# Patient Record
Sex: Female | Born: 1940 | Race: White | Hispanic: No | Marital: Married | Smoking: Never smoker
Health system: Southern US, Community
[De-identification: ages and names within clinical notes are randomized; demographics above are authoritative.]

## PROBLEM LIST (undated history)

## (undated) DIAGNOSIS — I10 Essential (primary) hypertension: Secondary | ICD-10-CM

## (undated) DIAGNOSIS — M1990 Unspecified osteoarthritis, unspecified site: Secondary | ICD-10-CM

## (undated) DIAGNOSIS — K589 Irritable bowel syndrome without diarrhea: Secondary | ICD-10-CM

---

## 2010-03-16 ENCOUNTER — Encounter
Admission: RE | Admit: 2010-03-16 | Discharge: 2010-03-16 | Payer: Self-pay | Source: Home / Self Care | Attending: Gastroenterology | Admitting: Gastroenterology

## 2011-04-28 ENCOUNTER — Encounter (HOSPITAL_COMMUNITY): Payer: Self-pay | Admitting: Emergency Medicine

## 2011-04-28 ENCOUNTER — Emergency Department (HOSPITAL_COMMUNITY)
Admission: EM | Admit: 2011-04-28 | Discharge: 2011-04-28 | Disposition: A | Discharge status: Home / Self Care | Payer: Medicare Other | Attending: Emergency Medicine | Admitting: Emergency Medicine

## 2011-04-28 ENCOUNTER — Emergency Department (HOSPITAL_COMMUNITY): Payer: Medicare Other

## 2011-04-28 DIAGNOSIS — R112 Nausea with vomiting, unspecified: Secondary | ICD-10-CM | POA: Insufficient documentation

## 2011-04-28 DIAGNOSIS — R509 Fever, unspecified: Secondary | ICD-10-CM | POA: Insufficient documentation

## 2011-04-28 DIAGNOSIS — K589 Irritable bowel syndrome without diarrhea: Secondary | ICD-10-CM | POA: Insufficient documentation

## 2011-04-28 DIAGNOSIS — R109 Unspecified abdominal pain: Secondary | ICD-10-CM | POA: Insufficient documentation

## 2011-04-28 DIAGNOSIS — Z79899 Other long term (current) drug therapy: Secondary | ICD-10-CM | POA: Insufficient documentation

## 2011-04-28 DIAGNOSIS — R000 Tachycardia, unspecified: Secondary | ICD-10-CM | POA: Insufficient documentation

## 2011-04-28 DIAGNOSIS — R197 Diarrhea, unspecified: Secondary | ICD-10-CM | POA: Insufficient documentation

## 2011-04-28 HISTORY — DX: Unspecified osteoarthritis, unspecified site: M19.90

## 2011-04-28 HISTORY — DX: Irritable bowel syndrome without diarrhea: K58.9

## 2011-04-28 HISTORY — DX: Essential (primary) hypertension: I10

## 2011-04-28 LAB — COMPREHENSIVE METABOLIC PANEL
ALT: 19 U/L (ref 0–35)
Albumin: 3.4 g/dL — ABNORMAL LOW (ref 3.5–5.2)
BUN: 14 mg/dL (ref 6–23)
Calcium: 9.7 mg/dL (ref 8.4–10.5)
Chloride: 106 mEq/L (ref 96–112)
GFR calc Af Amer: 79 mL/min — ABNORMAL LOW (ref >90)
GFR calc non Af Amer: 68 mL/min — ABNORMAL LOW (ref >90)
Potassium: 4.3 mEq/L (ref 3.5–5.1)
Total Bilirubin: 0.8 mg/dL (ref 0.3–1.2)

## 2011-04-28 LAB — DIFFERENTIAL
Basophils Absolute: 0 10*3/uL (ref 0.0–0.1)
Basophils Relative: 0 % (ref 0–1)
Eosinophils Absolute: 0 10*3/uL (ref 0.0–0.7)
Eosinophils Relative: 0 % (ref 0–5)
Lymphs Abs: 2.3 10*3/uL (ref 0.7–4.0)
Monocytes Absolute: 1.2 10*3/uL — ABNORMAL HIGH (ref 0.1–1.0)

## 2011-04-28 LAB — URINALYSIS, ROUTINE W REFLEX MICROSCOPIC
Bilirubin Urine: NEGATIVE
Hgb urine dipstick: NEGATIVE
Leukocytes, UA: NEGATIVE
Nitrite: NEGATIVE
Specific Gravity, Urine: 1.009 (ref 1.005–1.030)

## 2011-04-28 LAB — CBC
HCT: 40.1 % (ref 36.0–46.0)
MCHC: 33.7 g/dL (ref 30.0–36.0)
MCV: 85.3 fL (ref 78.0–100.0)
RBC: 4.7 MIL/uL (ref 3.87–5.11)
RDW: 13.3 % (ref 11.5–15.5)

## 2011-04-28 LAB — OCCULT BLOOD, POC DEVICE: Fecal Occult Bld: NEGATIVE

## 2011-04-28 MED ORDER — GI COCKTAIL ~~LOC~~
30.0000 mL | Freq: Once | ORAL | Status: AC
  Administered 2011-04-28: 30 mL via ORAL
  Filled 2011-04-28: qty 30

## 2011-04-28 MED ORDER — DICYCLOMINE HCL 10 MG PO CAPS
20.0000 mg | ORAL_CAPSULE | Freq: Once | ORAL | Status: DC
  Filled 2011-04-28: qty 2

## 2011-04-28 MED ORDER — DICYCLOMINE HCL 20 MG PO TABS: 20.0000 mg | ORAL_TABLET | Freq: Two times a day (BID) | ORAL | Status: DC

## 2011-04-28 MED ORDER — SODIUM CHLORIDE 0.9 % IV BOLUS (SEPSIS)
1000.0000 mL | Freq: Once | INTRAVENOUS | Status: AC
  Administered 2011-04-28: 1000 mL via INTRAVENOUS

## 2011-04-28 MED ORDER — IOHEXOL 300 MG/ML  SOLN: 20.0000 mL | INTRAMUSCULAR | Status: AC

## 2011-04-28 MED ORDER — DICYCLOMINE HCL 10 MG/ML IM SOLN
20.0000 mg | Freq: Once | INTRAMUSCULAR | Status: DC
  Filled 2011-04-28 (×2): qty 2

## 2011-04-28 MED ORDER — IOHEXOL 300 MG/ML  SOLN
100.0000 mL | Freq: Once | INTRAMUSCULAR | Status: AC | PRN
  Administered 2011-04-28: 100 mL via INTRAVENOUS

## 2011-04-28 MED ORDER — DICYCLOMINE HCL 20 MG PO TABS
20.0000 mg | ORAL_TABLET | Freq: Once | ORAL | Status: AC
  Administered 2011-04-28: 20 mg via ORAL
  Filled 2011-04-28: qty 1

## 2011-04-28 MED ORDER — ONDANSETRON HCL 4 MG/2ML IJ SOLN
4.0000 mg | Freq: Once | INTRAMUSCULAR | Status: AC
  Administered 2011-04-28: 4 mg via INTRAVENOUS
  Filled 2011-04-28: qty 2

## 2011-04-28 NOTE — ED Provider Notes (Signed)
History     CSN: [id number]  Arrival date & time 04/28/11  1732   First MD Initiated Contact with Patient 04/28/11 1855      Chief Complaint  Patient presents with  . Emesis  . Diarrhea  . Fever    HPI The patient has a possible history of IBS.  She notes a history of episodic abdominal pain with nausea, vomiting, diarrhea.  Today she was experiencing atypical episode, that began gradually, with crampy diffuse abdominal discomfort and nausea that progressed to vomiting and diarrhea.  The patient also experienced chills, which is not entirely atypical but infrequently occurring.  The patient went to her physician's office and was found to be febrile she was referred here for additional evaluation. The patient notes minimal relief with OTC medications.  No clear exacerbating factors. No dyspnea, no chest pain, no confusion, no disorientation, no lower extremity edema, the patient notes that she is otherwise in generally good health. Past Medical History  Diagnosis Date  . Hypertension   . Arthritis   . IBS (irritable bowel syndrome)     History reviewed. No pertinent past surgical history.  History reviewed. No pertinent family history.  History  Substance Use Topics  . Smoking status: Never Smoker   . Smokeless tobacco: Not on file  . Alcohol Use: No    OB History    Grav Para Term Preterm Abortions TAB SAB Ect Mult Living                  Review of Systems  Constitutional:       HPI  HENT:       HPI otherwise negative  Eyes: Negative.   Respiratory:       HPI, otherwise negative  Cardiovascular:       HPI, otherwise nmegative  Gastrointestinal: Positive for vomiting.  Genitourinary:       HPI, otherwise negative  Musculoskeletal:       HPI, otherwise negative  Skin: Negative.   Neurological: Negative for syncope.    Allergies  Review of patient's allergies indicates no known allergies.  Home Medications   Current Outpatient Rx  Name Route Sig  Dispense Refill  . AMLODIPINE BESYLATE 5 MG PO TABS Oral Take 5 mg by mouth daily.    . ASPIRIN 81 MG PO TABS Oral Take 81 mg by mouth daily.    Marland Kitchen BENAZEPRIL HCL 10 MG PO TABS Oral Take 10 mg by mouth daily.    Marland Kitchen CLINDINIUM-CHLORDIAZEPOXIDE 2.5-5 MG PO CAPS Oral Take 1 capsule by mouth 4 (four) times daily -  before meals and at bedtime. For anxiety    . DICLOFENAC SODIUM ER 100 MG PO TB24 Oral Take 100 mg by mouth daily.    . OMEGA-3 FATTY ACIDS 1000 MG PO CAPS Oral Take 1 g by mouth daily.    Marland Kitchen PRENATAL MULTIVITAMIN CH Oral Take 1 tablet by mouth daily.    Marland Kitchen ROSUVASTATIN CALCIUM 10 MG PO TABS Oral Take 5 mg by mouth daily.      BP 120/61  Pulse 95  Temp(Src) 98.1 F (36.7 C) (Oral)  Resp 18  Ht 5' (1.524 m)  Wt 156 lb (70.761 kg)  BMI 30.47 kg/m2  SpO2 96%  Physical Exam  Nursing note and vitals reviewed. Constitutional: She is oriented to person, place, and time. She appears well-developed and well-nourished. No distress.  HENT:  Head: Normocephalic and atraumatic.  Eyes: Conjunctivae and EOM are normal.  Cardiovascular: Regular rhythm.  Tachycardia present.   Pulmonary/Chest: Effort normal and breath sounds normal. No stridor. No respiratory distress.  Abdominal: She exhibits no distension.  Musculoskeletal: She exhibits no edema.  Neurological: She is alert and oriented to person, place, and time. No cranial nerve deficit.  Skin: Skin is warm and dry.  Psychiatric: She has a normal mood and affect.    ED Course  Procedures (including critical care time)   Labs Reviewed  COMPREHENSIVE METABOLIC PANEL  CBC  DIFFERENTIAL  LIPASE, BLOOD   No results found.   No diagnosis found.  Pulse oximetry 100% room air normal  CT reviewed by me    MDM  This 71 year old female now presents with abdominal discomfort, vomiting, diarrhea.  The patient has a long history of IBS, incompletely diagnosed.  She notes frequent, episodic bouts of similar discomfort to that which  brought her in today.  She notes that gradually, approximately 6 hours ago she developed abdominal discomfort, nausea.  Since that time she has had innumerable amounts of diarrhea, with urge incontinence.  She also notes several episodes of emesis.  She denies significant pain, notes that the discomfort is more an unsettled sensation.  This episode lasted longer than her typical episodes, and she presents for evaluation.  She denies any fevers, notes that she does have chills.  This is not atypical for these episodes.  She denies dysuria, confusion, disorientation.  She notes that her episodes typically resolve if she can go to sleep.  She has not attempted to get for this episode.  The patient's labs, CT are suggestive of inflammatory condition.  A prolonged discussion was conducted with the patient and her family regarding the necessity for continued outpatient evaluation.  She was advised to seek additional evaluation from a subspecialist, at one of the [facility].  She was discharged in stable condition        Gerhard Munch, MD 04/28/11 2304

## 2011-04-28 NOTE — ED Notes (Signed)
Pt c/o N/V/D starting today with fever; pt sts hx of same with IBS; pt sts sent here for eval; pt abd tender to palpation

## 2011-04-28 NOTE — ED Notes (Signed)
CT notified of patient's completing po CT contrast.

## 2014-04-10 ENCOUNTER — Other Ambulatory Visit: Payer: Self-pay | Admitting: Gastroenterology

## 2014-04-10 DIAGNOSIS — R1084 Generalized abdominal pain: Secondary | ICD-10-CM

## 2014-04-17 ENCOUNTER — Ambulatory Visit
Admission: RE | Admit: 2014-04-17 | Discharge: 2014-04-17 | Disposition: A | Discharge status: Home / Self Care | Payer: Medicare Other | Source: Ambulatory Visit | Attending: Gastroenterology | Admitting: Gastroenterology

## 2014-04-17 ENCOUNTER — Other Ambulatory Visit: Payer: Self-pay | Admitting: Gastroenterology

## 2014-04-17 DIAGNOSIS — R1084 Generalized abdominal pain: Secondary | ICD-10-CM

## 2014-04-18 ENCOUNTER — Ambulatory Visit
Admission: RE | Admit: 2014-04-18 | Discharge: 2014-04-18 | Disposition: A | Discharge status: Home / Self Care | Payer: Medicare Other | Source: Ambulatory Visit | Attending: Gastroenterology | Admitting: Gastroenterology

## 2014-04-18 DIAGNOSIS — R1084 Generalized abdominal pain: Secondary | ICD-10-CM

## 2014-04-18 MED ORDER — IOHEXOL 300 MG/ML  SOLN
100.0000 mL | Freq: Once | INTRAMUSCULAR | Status: AC | PRN
  Administered 2014-04-18: 100 mL via INTRAVENOUS

## 2014-04-21 ENCOUNTER — Other Ambulatory Visit (HOSPITAL_COMMUNITY): Payer: Self-pay | Admitting: Gastroenterology

## 2014-04-21 ENCOUNTER — Encounter (HOSPITAL_COMMUNITY): Payer: Self-pay | Admitting: Pharmacy Technician

## 2014-04-21 DIAGNOSIS — C8590 Non-Hodgkin lymphoma, unspecified, unspecified site: Secondary | ICD-10-CM

## 2014-04-22 ENCOUNTER — Other Ambulatory Visit: Payer: Self-pay | Admitting: Radiology

## 2014-04-23 ENCOUNTER — Encounter (HOSPITAL_COMMUNITY): Payer: Self-pay

## 2014-04-23 ENCOUNTER — Ambulatory Visit (HOSPITAL_COMMUNITY)
Admission: RE | Admit: 2014-04-23 | Discharge: 2014-04-23 | Disposition: A | Discharge status: Home / Self Care | Payer: Medicare Other | Source: Ambulatory Visit | Attending: Gastroenterology | Admitting: Gastroenterology

## 2014-04-23 DIAGNOSIS — R109 Unspecified abdominal pain: Secondary | ICD-10-CM | POA: Diagnosis not present

## 2014-04-23 DIAGNOSIS — R634 Abnormal weight loss: Secondary | ICD-10-CM | POA: Insufficient documentation

## 2014-04-23 DIAGNOSIS — R590 Localized enlarged lymph nodes: Secondary | ICD-10-CM | POA: Diagnosis present

## 2014-04-23 DIAGNOSIS — I10 Essential (primary) hypertension: Secondary | ICD-10-CM | POA: Insufficient documentation

## 2014-04-23 DIAGNOSIS — C8590 Non-Hodgkin lymphoma, unspecified, unspecified site: Secondary | ICD-10-CM

## 2014-04-23 DIAGNOSIS — K589 Irritable bowel syndrome without diarrhea: Secondary | ICD-10-CM | POA: Insufficient documentation

## 2014-04-23 DIAGNOSIS — Z79899 Other long term (current) drug therapy: Secondary | ICD-10-CM | POA: Insufficient documentation

## 2014-04-23 LAB — CBC
HCT: 43.5 % (ref 36.0–46.0)
HEMOGLOBIN: 14.2 g/dL (ref 12.0–15.0)
MCH: 26.7 pg (ref 26.0–34.0)
MCHC: 32.6 g/dL (ref 30.0–36.0)
MCV: 81.8 fL (ref 78.0–100.0)
Platelets: 188 10*3/uL (ref 150–400)
RBC: 5.32 MIL/uL — ABNORMAL HIGH (ref 3.87–5.11)
RDW: 13.7 % (ref 11.5–15.5)
WBC: 7.4 10*3/uL (ref 4.0–10.5)

## 2014-04-23 LAB — APTT: aPTT: 28 seconds (ref 24–37)

## 2014-04-23 LAB — PROTIME-INR
INR: 1.02 (ref 0.00–1.49)
Prothrombin Time: 13.5 seconds (ref 11.6–15.2)

## 2014-04-23 MED ORDER — SODIUM CHLORIDE 0.9 % IV SOLN
INTRAVENOUS | Status: AC | PRN
  Administered 2014-04-23: 10 mL/h via INTRAVENOUS

## 2014-04-23 MED ORDER — MIDAZOLAM HCL 2 MG/2ML IJ SOLN
INTRAMUSCULAR | Status: AC
  Filled 2014-04-23: qty 2

## 2014-04-23 MED ORDER — SODIUM CHLORIDE 0.9 % IV SOLN: Freq: Once | INTRAVENOUS | Status: DC

## 2014-04-23 MED ORDER — FENTANYL CITRATE 0.05 MG/ML IJ SOLN
INTRAMUSCULAR | Status: AC
  Filled 2014-04-23: qty 2

## 2014-04-23 MED ORDER — HYDROCODONE-ACETAMINOPHEN 5-325 MG PO TABS
1.0000 | ORAL_TABLET | ORAL | Status: DC | PRN
  Filled 2014-04-23: qty 2

## 2014-04-23 MED ORDER — MIDAZOLAM HCL 2 MG/2ML IJ SOLN
INTRAMUSCULAR | Status: AC | PRN
  Administered 2014-04-23: 1 mg via INTRAVENOUS

## 2014-04-23 MED ORDER — LIDOCAINE HCL 1 % IJ SOLN
INTRAMUSCULAR | Status: AC
  Filled 2014-04-23: qty 20

## 2014-04-23 MED ORDER — FENTANYL CITRATE 0.05 MG/ML IJ SOLN
INTRAMUSCULAR | Status: AC | PRN
  Administered 2014-04-23: 50 ug via INTRAVENOUS

## 2014-04-23 NOTE — Sedation Documentation (Signed)
Patient denies pain and is resting comfortably.  

## 2014-04-23 NOTE — Discharge Instructions (Signed)
Needle Biopsy °Care After °These instructions give you information on caring for yourself after your procedure. Your doctor may also give you more specific instructions. Call your doctor if you have any problems or questions after your procedure. °HOME CARE °· Rest for 4 hours after your biopsy, except for getting up to go to the bathroom or as told. °· Keep the places where the needles were put in clean and dry. °¨ Do not put powder or lotion on the sites. °¨ Do not shower until 24 hours after the test. Remove all bandages (dressings) before showering. °¨ Remove all bandages at least once every day. Gently clean the sites with soap and water. Keep putting a new bandage on until the skin is closed. °Finding out the results of your test °Ask your doctor when your test results will be ready. Make sure you follow up and get the test results. °GET HELP RIGHT AWAY IF:  °· You have shortness of breath or trouble breathing. °· You have pain or cramping in your belly (abdomen). °· You feel sick to your stomach (nauseous) or throw up (vomit). °· Any of the places where the needles were put in: °¨ Are puffy (swollen) or red. °¨ Are sore or hot to the touch. °¨ Are draining yellowish-white fluid (pus). °¨ Are bleeding after 10 minutes of pressing down on the site. Have someone keep pressing on any place that is bleeding until you see a doctor. °· You have any unusual pain that will not stop. °· You have a fever. °If you go to the emergency room, tell the nurse that you had a biopsy. Take this paper with you to show the nurse. °MAKE SURE YOU:  °· Understand these instructions. °· Will watch your condition. °· Will get help right away if you are not doing well or get worse. °Document Released: 02/18/2008 Document Revised: 05/30/2011 Document Reviewed: 02/18/2008 °Exit[employer]® Patient Information ©2015 [employer]. This information is not intended to replace advice given to you by your health care provider. Make sure you discuss  any questions you have with your health care provider. ° °

## 2014-04-23 NOTE — H&P (Signed)
Reason for Consult: Lymphadenopathy   Referring Physician(s): Magod,Marc E  History of Present Illness: Tiffany Randall is a 74 y.o. female with c/o abdominal pain x 2-3 weeks and weight loss of 30 lbs over 6 months. She has been seen by [name] and scheduled today for an image guided abdominal lymph node biopsy. She c/o ongoing abdominal pain and back pain. She denies any chest pain, shortness of breath or palpitations. She denies any active signs of bleeding or excessive bruising. She denies any recent fever or chills. The patient denies any history of sleep apnea or chronic oxygen use. She has previously tolerated sedation without complications.   Past Medical History  Diagnosis Date  . Hypertension   . Arthritis   . IBS (irritable bowel syndrome)    History reviewed. No pertinent past surgical history.  Allergies: Contrast media and Iron  Medications: Prior to Admission medications   Medication Sig Start Date End Date Taking? Authorizing Provider  acetaminophen (TYLENOL) 500 MG tablet Take 500 mg by mouth every 6 (six) hours as needed for moderate pain.   Yes Historical Provider, MD  amLODipine (NORVASC) 5 MG tablet Take 5 mg by mouth daily.   Yes Historical Provider, MD  atorvastatin (LIPITOR) 20 MG tablet Take 20 mg by mouth daily at 6 PM.   Yes Historical Provider, MD  benazepril (LOTENSIN) 10 MG tablet Take 10 mg by mouth daily.   Yes Historical Provider, MD  omeprazole (PRILOSEC) 40 MG capsule Take 40 mg by mouth 2 (two) times daily.   Yes Historical Provider, MD  dicyclomine (BENTYL) 20 MG tablet Take 1 tablet (20 mg total) by mouth 2 (two) times daily. Patient not taking: Reported on 04/21/2014 04/28/11 04/27/12  Carmin Muskrat, MD    History reviewed. No pertinent family history.  History   Social History  . Marital Status: Married    Spouse Name: N/A    Number of Children: N/A  . Years of Education: N/A   Social History Main Topics  . Smoking status: Never  Smoker   . Smokeless tobacco: None  . Alcohol Use: No  . Drug Use: No  . Sexual Activity: None   Other Topics Concern  . None   Social History Narrative   Review of Systems: A 12 point ROS discussed and pertinent positives are indicated in the HPI above.  All other systems are negative.  Review of Systems  Vital Signs: BP 133/64 mmHg  Pulse 67  Temp(Src) 97.8 F (36.6 C) (Oral)  Resp 20  Ht 4\' 11"  (1.499 m)  Wt 126 lb (57.153 kg)  BMI 25.44 kg/m2  SpO2 97%  Physical Exam  Constitutional: She is oriented to person, place, and time. No distress.  HENT:  Head: Normocephalic and atraumatic.  Neck: No tracheal deviation present.  Cardiovascular: Normal rate and regular rhythm.  Exam reveals no gallop and no friction rub.   No murmur heard. Pulmonary/Chest: Effort normal and breath sounds normal. No respiratory distress. She has no wheezes. She has no rales.  Abdominal: Soft. Bowel sounds are normal.  Neurological: She is alert and oriented to person, place, and time.  Skin: She is not diaphoretic.   Imaging: Ct Entero Abd/pelvis W/cm  04/18/2014   CLINICAL DATA:  Abdominal pain for 2 weeks.  EXAM: CT ABDOMEN AND PELVIS WITH CONTRAST (ENTEROGRAPHY)  TECHNIQUE: Multidetector CT of the abdomen and pelvis during bolus administration of intravenous contrast. Negative oral contrast VoLumen was given.  CONTRAST:  100 cc of  Omni 300  COMPARISON:  04/09/2014  FINDINGS: Lower chest: No pleural effusion identified. The lung bases are clear.  Hepatobiliary: There is no suspicious liver abnormalities. The gallbladder is normal. No biliary dilatation.  Pancreas: The pancreas appears normal.  Spleen: Normal appearance of the spleen.  Adrenals/Urinary Tract: The adrenal glands are both normal. Unremarkable appearance of the kidneys. The urinary bladder has a normal appearance.  Stomach/Bowel: The stomach is normal. Within the left lower quadrant of the abdomen there is a 9 cm segment of small  bowel which exhibits abnormal wall thickening, mucosal enhancement, and surrounding inflammation. Single wall thickness measures 7 mm, image 46/series 4. The lumen of the small bowel is diminished in this area and there is a focal area of intramural ulceration, image 55 of series 601. There is a second segment of affected small bowel more distally. This measures 2.2 cm in length and a single wall thickness measures up to 8 mm, image 29/series 601. Additionally, there is a central area of small bowel loops which appear tethered, image 29 of series 601. No evidence for bowel obstruction, abscess formation or fistula formation. The terminal ileum is within normal limits. The colon is normal.  Vascular/Lymphatic: Calcified atherosclerotic disease involves the abdominal aorta. No aneurysm. There is soft tissue infiltration within the small bowel mesenteric. Several enlarged lymph nodes are noted. The largest measures 1.2 cm in short axis, image 78/series 3.  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  Other: No free fluid or fluid collections identified.  Musculoskeletal: Review of the visualized bony structures is significant for scoliosis and multi level degenerative disc disease.  IMPRESSION: 1. Again noted are two separate segments of abnormal small bowel wall thickening, mucosal enhancement and inflammation. The larger segment has a focal area of intramural ulceration. Findings are worrisome for inflammatory enteritis, specifically Crohn's enteritis. There is no evidence for bowel obstruction, fistula formation or abscess formation. 2. Soft tissue infiltration within the small bowel mesenteric along with multiple prominent lymph nodes. The largest measures 1.2 cm. In the setting of small bowel enteritis this is likely reactive. Malignancy, including lymphoproliferative disorders may also have a similar appearance and three-month follow-up examination is recommended to ensure resolution. If after 3 months  this does not resolve then consider further assessment with PET-CT and possible tissue sampling.   Electronically Signed   By: Kerby Moors M.D.   On: 04/18/2014 15:56   Labs:  CBC: No results for input(s): WBC, HGB, HCT, PLT in the last 8760 hours.  COAGS: No results for input(s): INR, APTT in the last 8760 hours.  BMP: No results for input(s): NA, K, CL, CO2, GLUCOSE, BUN, CALCIUM, CREATININE, GFRNONAA, GFRAA in the last 8760 hours.  Invalid input(s): CMP  Assessment and Plan: Abdominal pain 3 weeks Weight loss 30 lbs over 6 months Abdominal lymphadenopathy Seen by [name] and scheduled today for image guided biopsy with moderate sedation Patient has been NPO, no blood thinners taken, labs reviewed Risks and Benefits discussed with the patient. All of the patient's questions were answered, patient is agreeable to proceed. Consent signed and in chart.   SignedHedy Jacob 04/23/2014, 8:23 AM

## 2014-04-23 NOTE — Procedures (Signed)
CT guided core biopsies of mesenteric lymph node.  3 cores obtained.  Specimens in saline.  No immediate complication.

## 2014-06-05 ENCOUNTER — Other Ambulatory Visit: Payer: Self-pay | Admitting: Gastroenterology

## 2014-06-05 DIAGNOSIS — R933 Abnormal findings on diagnostic imaging of other parts of digestive tract: Secondary | ICD-10-CM

## 2014-06-05 DIAGNOSIS — R109 Unspecified abdominal pain: Secondary | ICD-10-CM

## 2014-06-17 ENCOUNTER — Ambulatory Visit
Admission: RE | Admit: 2014-06-17 | Discharge: 2014-06-17 | Disposition: A | Discharge status: Home / Self Care | Payer: Medicare Other | Source: Ambulatory Visit | Attending: Gastroenterology | Admitting: Gastroenterology

## 2014-06-17 DIAGNOSIS — R109 Unspecified abdominal pain: Secondary | ICD-10-CM

## 2014-06-17 DIAGNOSIS — R933 Abnormal findings on diagnostic imaging of other parts of digestive tract: Secondary | ICD-10-CM

## 2014-06-17 MED ORDER — IOPAMIDOL (ISOVUE-300) INJECTION 61%
100.0000 mL | Freq: Once | INTRAVENOUS | Status: AC | PRN
  Administered 2014-06-17: 100 mL via INTRAVENOUS

## 2014-08-04 ENCOUNTER — Other Ambulatory Visit: Payer: Self-pay | Admitting: Gastroenterology

## 2015-12-11 ENCOUNTER — Inpatient Hospital Stay (HOSPITAL_COMMUNITY)
Admission: EM | Admit: 2015-12-11 | Discharge: 2015-12-14 | DRG: 386 | Disposition: A | Discharge status: Home / Self Care | Payer: Medicare Other | Source: Other Acute Inpatient Hospital | Attending: Internal Medicine | Admitting: Internal Medicine

## 2015-12-11 ENCOUNTER — Encounter (HOSPITAL_COMMUNITY): Payer: Self-pay

## 2015-12-11 ENCOUNTER — Inpatient Hospital Stay (HOSPITAL_COMMUNITY): Payer: Medicare Other

## 2015-12-11 DIAGNOSIS — K50912 Crohn's disease, unspecified, with intestinal obstruction: Secondary | ICD-10-CM | POA: Diagnosis present

## 2015-12-11 DIAGNOSIS — K219 Gastro-esophageal reflux disease without esophagitis: Secondary | ICD-10-CM | POA: Diagnosis present

## 2015-12-11 DIAGNOSIS — K50012 Crohn's disease of small intestine with intestinal obstruction: Secondary | ICD-10-CM | POA: Diagnosis not present

## 2015-12-11 DIAGNOSIS — E785 Hyperlipidemia, unspecified: Secondary | ICD-10-CM | POA: Diagnosis present

## 2015-12-11 DIAGNOSIS — K5090 Crohn's disease, unspecified, without complications: Secondary | ICD-10-CM

## 2015-12-11 DIAGNOSIS — R112 Nausea with vomiting, unspecified: Secondary | ICD-10-CM | POA: Diagnosis present

## 2015-12-11 DIAGNOSIS — K50019 Crohn's disease of small intestine with unspecified complications: Secondary | ICD-10-CM | POA: Diagnosis not present

## 2015-12-11 DIAGNOSIS — K56609 Unspecified intestinal obstruction, unspecified as to partial versus complete obstruction: Secondary | ICD-10-CM | POA: Diagnosis present

## 2015-12-11 DIAGNOSIS — Z91041 Radiographic dye allergy status: Secondary | ICD-10-CM | POA: Diagnosis not present

## 2015-12-11 DIAGNOSIS — D72829 Elevated white blood cell count, unspecified: Secondary | ICD-10-CM | POA: Diagnosis present

## 2015-12-11 DIAGNOSIS — Z79899 Other long term (current) drug therapy: Secondary | ICD-10-CM | POA: Diagnosis not present

## 2015-12-11 DIAGNOSIS — Z0189 Encounter for other specified special examinations: Secondary | ICD-10-CM

## 2015-12-11 DIAGNOSIS — I10 Essential (primary) hypertension: Secondary | ICD-10-CM

## 2015-12-11 DIAGNOSIS — K5669 Other intestinal obstruction: Secondary | ICD-10-CM | POA: Diagnosis not present

## 2015-12-11 LAB — COMPREHENSIVE METABOLIC PANEL
ALBUMIN: 3.8 g/dL (ref 3.5–5.0)
ALT: 15 U/L (ref 14–54)
ANION GAP: 8 (ref 5–15)
AST: 18 U/L (ref 15–41)
Alkaline Phosphatase: 105 U/L (ref 38–126)
BILIRUBIN TOTAL: 0.9 mg/dL (ref 0.3–1.2)
BUN: 10 mg/dL (ref 6–20)
CHLORIDE: 109 mmol/L (ref 101–111)
CO2: 25 mmol/L (ref 22–32)
Calcium: 9.7 mg/dL (ref 8.9–10.3)
Creatinine, Ser: 0.68 mg/dL (ref 0.44–1.00)
GFR calc Af Amer: >60 mL/min (ref >60)
GLUCOSE: 167 mg/dL — AB (ref 65–99)
POTASSIUM: 4.7 mmol/L (ref 3.5–5.1)
Sodium: 142 mmol/L (ref 135–145)
TOTAL PROTEIN: 7.2 g/dL (ref 6.5–8.1)

## 2015-12-11 LAB — CBC
HEMATOCRIT: 45.3 % (ref 36.0–46.0)
Hemoglobin: 14.6 g/dL (ref 12.0–15.0)
MCH: 27.4 pg (ref 26.0–34.0)
MCHC: 32.2 g/dL (ref 30.0–36.0)
MCV: 85 fL (ref 78.0–100.0)
PLATELETS: 234 10*3/uL (ref 150–400)
RBC: 5.33 MIL/uL — ABNORMAL HIGH (ref 3.87–5.11)
RDW: 13.1 % (ref 11.5–15.5)
WBC: 14.1 10*3/uL — AB (ref 4.0–10.5)

## 2015-12-11 LAB — MAGNESIUM: MAGNESIUM: 2 mg/dL (ref 1.7–2.4)

## 2015-12-11 LAB — PHOSPHORUS: Phosphorus: 3.6 mg/dL (ref 2.5–4.6)

## 2015-12-11 IMAGING — CT CT BIOPSY
2 of 5 series · 14 of 46 positions shown, 16 images · non-contrast
Comparison: none

CLINICAL DATA: 73-year-old with enlarged mesenteric lymph nodes and
small bowel wall thickening. Differential diagnosis includes
lymphoma versus Crohn's disease.

[Series 2: abd/ pelvis 5.0 i30f 1 · axial · 0.87mm/px · z∈[-255,-115]mm · 11 of 34 slices shown, 13 images]
[im 3/34  soft-tissue]
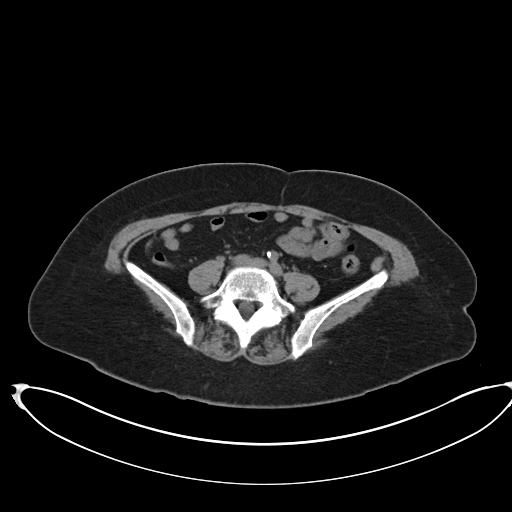
[im 3/34  bone]
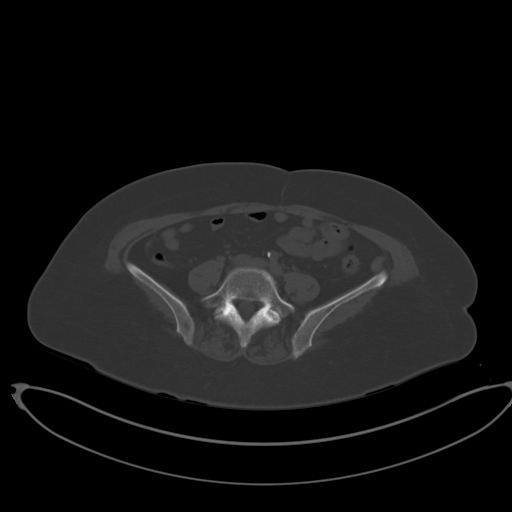
[im 6/34  soft-tissue]
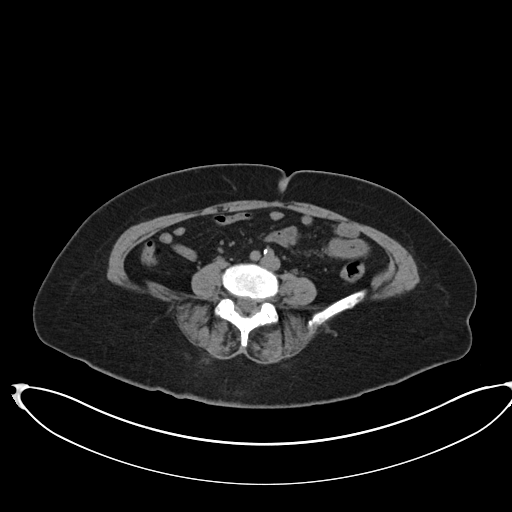
[im 9/34  soft-tissue]
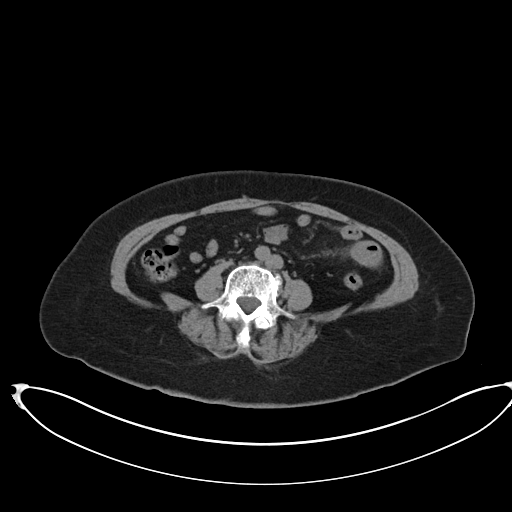
[im 12/34  soft-tissue]
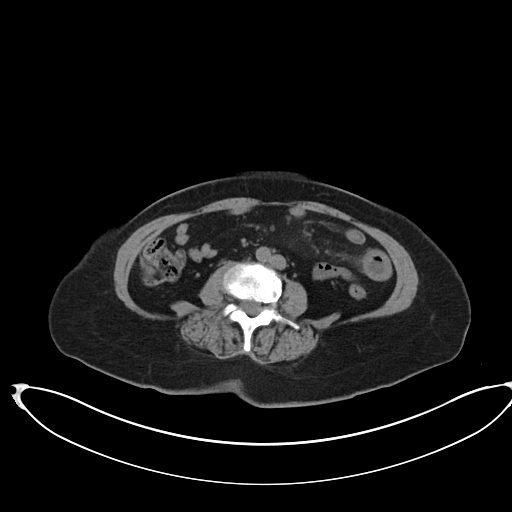
[im 14/34  soft-tissue]
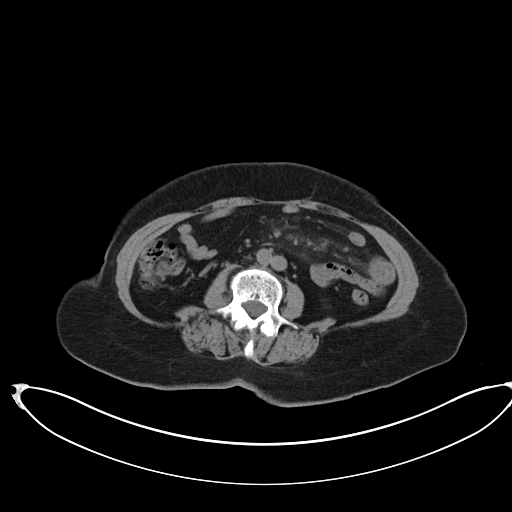
[im 17/34  soft-tissue]
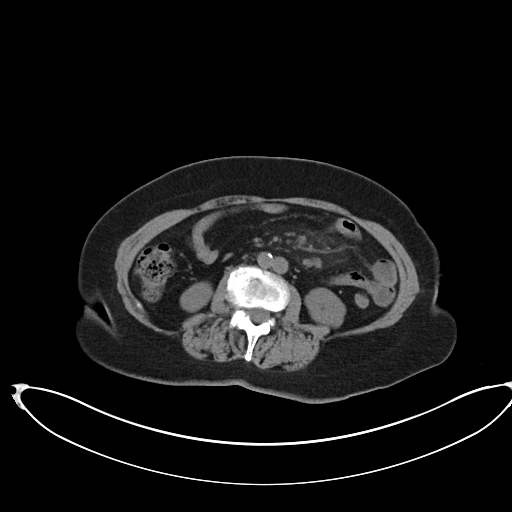
[im 20/34  soft-tissue]
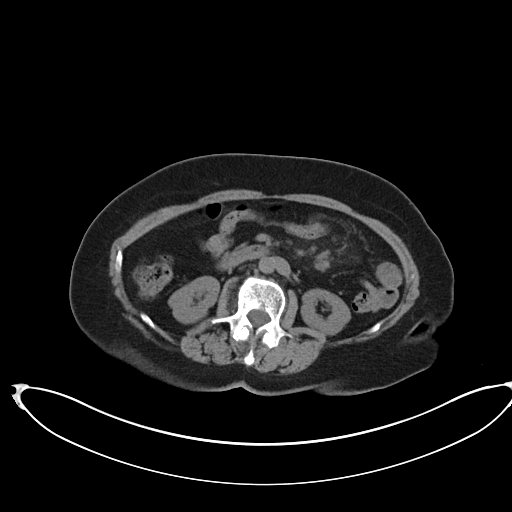
[im 23/34  soft-tissue]
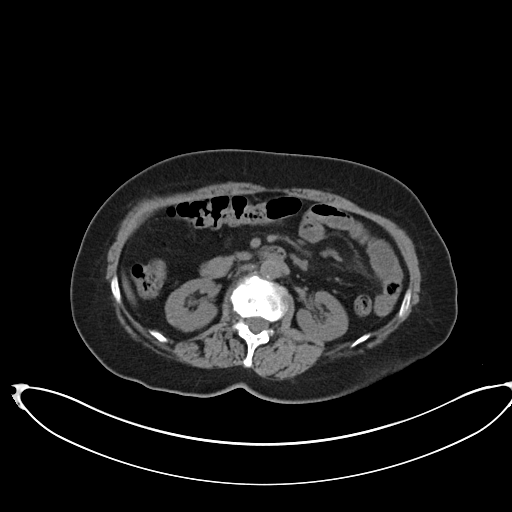
[im 25/34  soft-tissue]
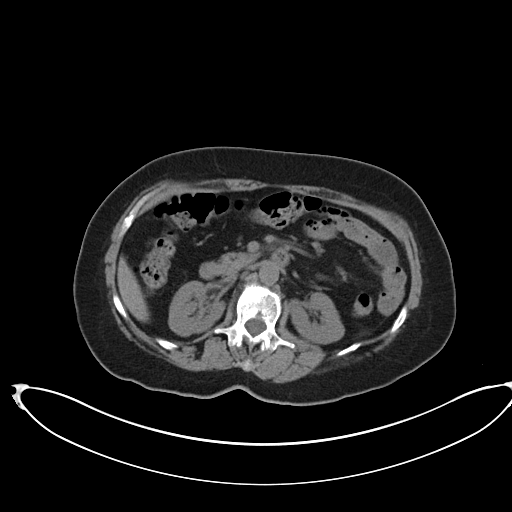
[im 25/34  bone]
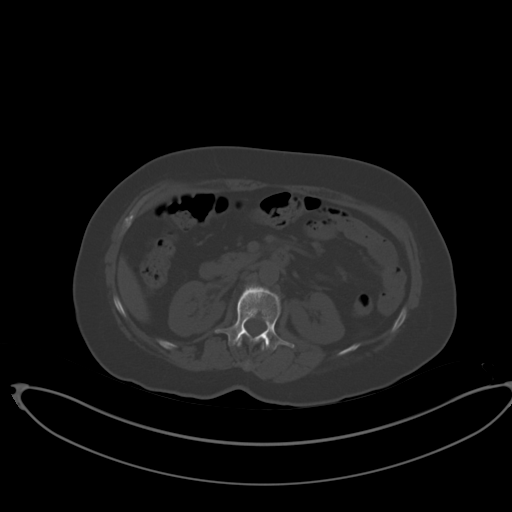
[im 28/34  soft-tissue]
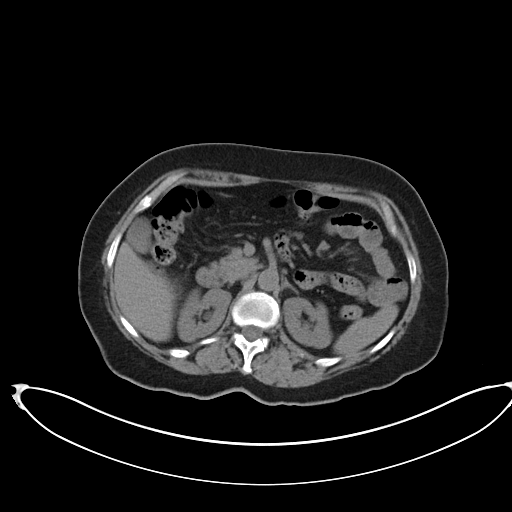
[im 31/34  soft-tissue]
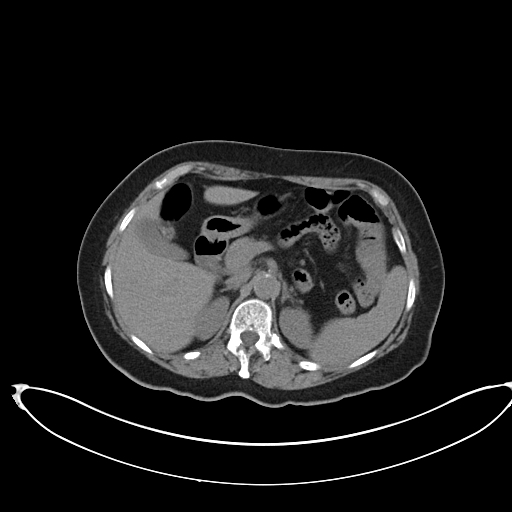

[Series 4: cor st · coronal · 0.39mm/px · 3 of 74 slices shown]
[im 25/74  soft-tissue]
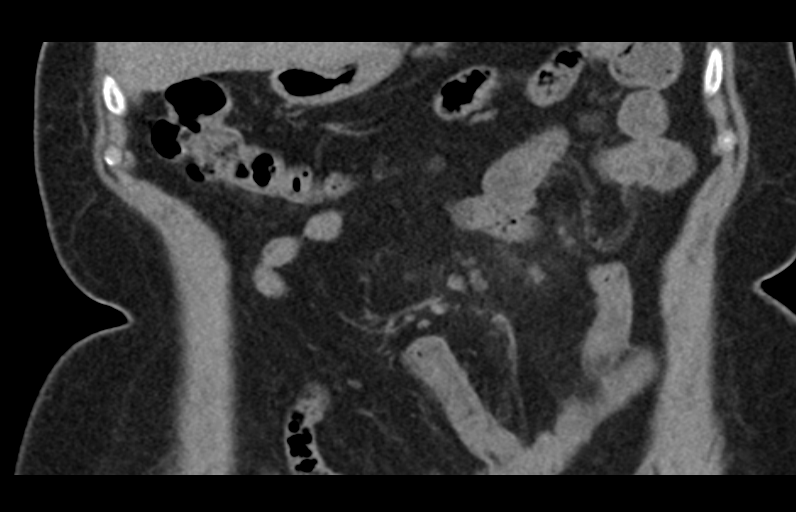
[im 33/74  soft-tissue]
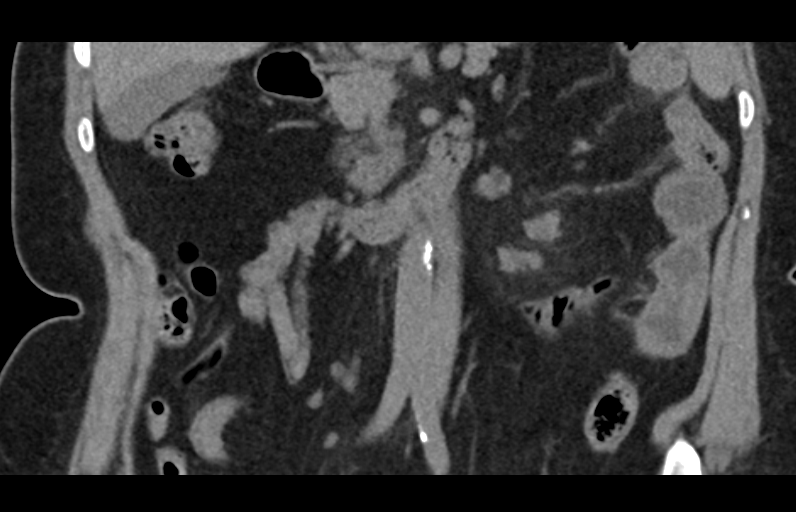
[im 41/74  soft-tissue]
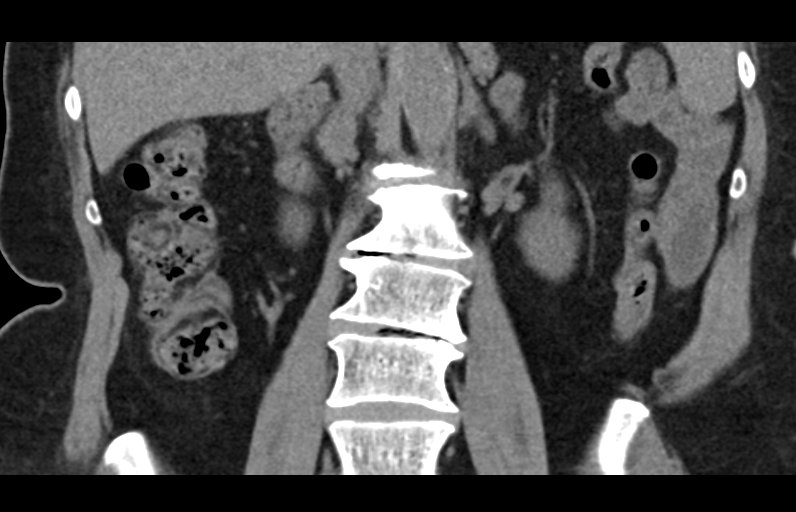

[14 of 46 positions shown; findings below may reference images not displayed]

EXAM:
CT-GUIDED BIOPSY OF MESENTERIC LYMPH NODE.

MEDICATIONS:
1 mg Versed, 50 mcg fentanyl. A radiology nurse monitored the
patient for moderate sedation.

ANESTHESIA/SEDATION:
Sedation time: 26 min

PROCEDURE:
The procedure was explained to the patient. The risks and benefits
of the procedure were discussed and the patient's questions were
addressed. Informed consent was obtained from the patient. The
patient was placed supine on the CT scanner. Images through the
abdomen were obtained. Conglomeration of lymph nodes in the central
left abdomen were targeted for biopsy. The left side of the abdomen
was prepped and draped in sterile fashion. Skin was anesthetized
with 1% lidocaine. Using CT guidance, a 17 gauge coaxial needle was
directed into the left abdominal mesentery. Needle was positioned
along the left side of a prominent lymph node. Total of 3 core
biopsies were obtained with an 18 gauge device. Specimens were
placed in saline. Needle was removed without complication. Bandage
placed over the puncture site.
FINDINGS: Again noted are multiple lymph nodes along the left side of the
central abdominal mesentery. Again noted is a thickened loop of
small bowel along the left side of the abdomen. A 1.3 cm lymph node
in the left central abdomen was targeted. Coaxial needle was
positioned adjacent to this lymph node.

COMPLICATIONS:
None
IMPRESSION: CT-guided core biopsies of a left abdominal mesenteric lymph node.

## 2015-12-11 MED ORDER — PHENOL 1.4 % MT LIQD
1.0000 | OROMUCOSAL | Status: DC | PRN
  Administered 2015-12-11 – 2015-12-12 (×2): 1 via OROMUCOSAL
  Filled 2015-12-11: qty 177

## 2015-12-11 MED ORDER — METRONIDAZOLE IN NACL 5-0.79 MG/ML-% IV SOLN
500.0000 mg | Freq: Three times a day (TID) | INTRAVENOUS | Status: DC
  Administered 2015-12-11 – 2015-12-14 (×10): 500 mg via INTRAVENOUS
  Filled 2015-12-11 (×10): qty 100

## 2015-12-11 MED ORDER — ENOXAPARIN SODIUM 40 MG/0.4ML ~~LOC~~ SOLN
40.0000 mg | SUBCUTANEOUS | Status: DC
  Administered 2015-12-11 – 2015-12-13 (×3): 40 mg via SUBCUTANEOUS
  Filled 2015-12-11 (×3): qty 0.4

## 2015-12-11 MED ORDER — HYDRALAZINE HCL 20 MG/ML IJ SOLN
5.0000 mg | Freq: Four times a day (QID) | INTRAMUSCULAR | Status: DC | PRN
  Administered 2015-12-12: 5 mg via INTRAVENOUS
  Filled 2015-12-11: qty 1

## 2015-12-11 MED ORDER — METHYLPREDNISOLONE SODIUM SUCC 40 MG IJ SOLR
40.0000 mg | Freq: Two times a day (BID) | INTRAMUSCULAR | Status: DC
  Administered 2015-12-11 – 2015-12-14 (×6): 40 mg via INTRAVENOUS
  Filled 2015-12-11 (×6): qty 1

## 2015-12-11 MED ORDER — MORPHINE SULFATE (PF) 2 MG/ML IV SOLN: 2.0000 mg | INTRAVENOUS | Status: DC | PRN

## 2015-12-11 MED ORDER — LORAZEPAM 2 MG/ML IJ SOLN
0.5000 mg | Freq: Once | INTRAMUSCULAR | Status: AC
  Administered 2015-12-11: 0.5 mg via INTRAVENOUS
  Filled 2015-12-11: qty 1

## 2015-12-11 MED ORDER — CEFEPIME HCL 1 G IJ SOLR
1.0000 g | Freq: Three times a day (TID) | INTRAMUSCULAR | Status: DC
  Administered 2015-12-11 – 2015-12-14 (×8): 1 g via INTRAVENOUS
  Filled 2015-12-11 (×11): qty 1

## 2015-12-11 MED ORDER — MORPHINE SULFATE (PF) 2 MG/ML IV SOLN: 0.5000 mg | INTRAVENOUS | Status: DC | PRN

## 2015-12-11 MED ORDER — DEXTROSE 5 % IV SOLN
1.0000 g | Freq: Once | INTRAVENOUS | Status: AC
  Administered 2015-12-11: 1 g via INTRAVENOUS
  Filled 2015-12-11: qty 1

## 2015-12-11 MED ORDER — SODIUM CHLORIDE 0.9 % IV SOLN
INTRAVENOUS | Status: DC
  Administered 2015-12-11 – 2015-12-12 (×2): via INTRAVENOUS

## 2015-12-11 NOTE — Consult Note (Signed)
[facility] Surgery Consult Note  Tiffany Randall 04/14/1940  [id number].    Requesting MD: Dr. Waldron Labs Chief Complaint/Reason for Consult: SBO HPI:  75 year-old female with a PMH of HTN, HLD, GERD, and Crohn's disease on immunotherapy (diagnosed 2016, [name]) who presents to [facility] ED with a Crohn's flare and small bowel obstruction diagnosed on CT scan. She reports 48 hours of abdominal pain located around her umbilicus and described as tight,sharp, and non-radiating. It is intermittent. Associated symptoms include chills, sweats, diarrhea, and vomiting. Denies flatus. Denies recent illness or treatment with abx, however she did just get the flu shot on Tuesday (3 days ago). Also recently decreased her Aprizo dosage by 50%. WBC 16.5. General surgery has been asked to consult for management of small bowel obstruction. She currently has a NG tube in place.   Past abdominal surgeries: tubal ligation and (at the same time) appendectomy  CT abd/pelv without contrast (allergy) 12/11/15: dilated SB loops with transition point in LLQ and "feces sign" above level of obstruction. inflamed small bowel.  ROS: All systems reviewed and otherwise negative except for as above  No family history on file.  Past Medical History:  Diagnosis Date  . Arthritis   . Hypertension   . IBS (irritable bowel syndrome)     No past surgical history on file.  Social History:  reports that she has never smoked. She does not have any smokeless tobacco history on file. She reports that she does not drink alcohol or use drugs.  Allergies:  Allergies  Allergen Reactions  . Contrast Media [Iodinated Diagnostic Agents] Itching, Rash and Other (See Comments)    Patient broke out in rash and itching 04-09-14 at [facility], patient needs pre meds  . Iohexol Itching and Rash    Pt. needs  pre-meds  . Iron Diarrhea    Medications Prior to Admission  Medication Sig Dispense Refill  .  acetaminophen (TYLENOL) 500 MG tablet Take 500 mg by mouth every 6 (six) hours as needed for moderate pain.    Marland Kitchen amLODipine (NORVASC) 5 MG tablet Take 5 mg by mouth daily.    Marland Kitchen atorvastatin (LIPITOR) 20 MG tablet Take 20 mg by mouth daily at 6 PM.    . benazepril (LOTENSIN) 10 MG tablet Take 10 mg by mouth daily.    Marland Kitchen omeprazole (PRILOSEC) 40 MG capsule Take 40 mg by mouth daily.       Blood pressure (!) 158/64, pulse 80, temperature 98.1 F (36.7 C), temperature source Oral, resp. rate 16, height 4\' 11"  (1.499 m), weight 65.8 kg (145 lb), SpO2 92 %. Physical Exam: General: pleasant, obese white female who is laying in bed in NAD - husband and 3 daughters in the room. HEENT: head is normocephalic, atraumatic.  Heart: regular, rate, and rhythm.  No obvious murmurs, gallops, or rubs noted.  Palpable pedal pulses bilaterally Lungs: CTAB, no wheezes, rhonchi, or rales noted.  Respiratory effort nonlabored Abd: soft, NT/ND, +BS MS: all 4 extremities are symmetrical with no cyanosis, clubbing, or edema. Skin: warm and dry with no masses, lesions, or rashes Psych:appropriate affect. Neuro:  A&Ox3, normal speech  Assessment/Plan SBO - abdominal pain, nausea, vomiting - NGT to LIWS - NPO, IVF  Crohn's Disease - GI consult  Leukocytosis - 16.5, empiric antibiotics for crohn's flare GERD - omemprazole Hypertension Hyperlipidemia  ID: [id number], flagyl 9/22>>  DVT Proph: Lovenox, SCD's  Dispo: GI recommendations, NGT and NPO  Jill Alexanders, PA-C  [facility] 12/11/2015, 3:09 PM Pager: [phone] Consults: [phone] Mon-Fri 7:00 am-4:30 pm Sat-Sun 7:00 am-11:30 am

## 2015-12-11 NOTE — H&P (Addendum)
TRH H&P   Patient Demographics:    Tiffany Randall, is a 75 y.o. female  MRN: [id number]   DOB - June 29, 1940  Admit Date - 12/11/2015  Outpatient Primary MD for the patient is [facility], [place]  Referring MD/NP/PA: [facility]  Outpatient Specialists: GI dr Watt Climes  Patient coming from: from [facility], lives at home  No chief complaint on file.     HPI:    Tiffany Randall  is a 76 y.o. female, With past medical history of Crohn's disease, hypertension, hyperlipidemia, diagnosed with Crohn's last year following with [name], she presents to [facility] with complaints of nausea, vomiting, abdominal discomfort and diarrhea, Tums had been going on for 24 hours, patient denies any surgical history besides tubal ligation, poor she's been followed by [facility], reports she has been doing good, where her immunotherapy  has been decreased by half, workup was significant for leukocytosis of 16.5, she was afebrile, otherwise notified by ED no other significant labs abnormalities, CT abdomen and pelvis significant for SBO and  Crohn's flare, transferred to [facility] to follow with her primary GI.reports significant improvement of her abdominal discomfort after NGT insertion and suction.     Review of systems:    In addition to the HPI above,  Reports Having fever at home, she is afebrile here  No Headache, No changes with Vision or hearing, No problems swallowing food or Liquids, No Chest pain, Cough or Shortness of Breatplanes of abdominal discomfort, nausea and vomiting, and diarrhea Blood in stool or Urine, No dysuria, No new skin rashes or bruises, No new joints pains-aches,  No new weakness, tingling, numbness in any extremity, No recent weight gain or loss, No polyuria, polydypsia or polyphagia, No significant Mental Stressors.  A full 10 point Review of Systems was done, except as stated  above, all other Review of Systems were negative.   With Past History of the following :    Past Medical History:  Diagnosis Date  . Arthritis   . Hypertension   . IBS (irritable bowel syndrome)     Crohn's disease   Surgical history significant only for tubal ligation    Social History:     Social History  Substance Use Topics  . Smoking status: Never Smoker  . Smokeless tobacco: Not on file  . Alcohol use No     Lives - At home   Mobility - Independent     Family History :   No family history of inflammatory bowel disease    Home Medications:   Prior to Admission medications   Medication Sig Start Date End Date Taking? Authorizing Provider  acetaminophen (TYLENOL) 500 MG tablet Take 500 mg by mouth every 6 (six) hours as needed for moderate pain.    Historical Provider, MD  amLODipine (NORVASC) 5 MG tablet Take 5 mg by mouth daily.    Historical Provider,  MD  atorvastatin (LIPITOR) 20 MG tablet Take 20 mg by mouth daily at 6 PM.    Historical Provider, MD  benazepril (LOTENSIN) 10 MG tablet Take 10 mg by mouth daily.    Historical Provider, MD  omeprazole (PRILOSEC) 40 MG capsule Take 40 mg by mouth 2 (two) times daily.    Historical Provider, MD     Allergies:     Allergies  Allergen Reactions  . Contrast Media [Iodinated Diagnostic Agents] Itching, Rash and Other (See Comments)    Patient broke out in rash and itching 04-09-14 at [facility], patient needs pre meds  . Iohexol Itching and Rash    Pt. needs  pre-meds  . Iron Diarrhea     Physical Exam:   Vitals  Blood pressure (!) 158/64, pulse 80, temperature 98.1 F (36.7 C), temperature source Oral, resp. rate 16, height 4\' 11"  (1.499 m), weight 65.8 kg (145 lb), SpO2 92 %.   1. General Well-developed female lying in bed in NAD,    2. Normal affect and insight, Not Suicidal or Homicidal, Awake Alert, Oriented X 3.  3. No F.N deficits, ALL C.Nerves Intact, Strength 5/5 all 4  extremities, Sensation intact all 4 extremities, Plantars down going.  4. Ears and Eyes appear Normal, Conjunctivae clear, PERRLA. Moist Oral Mucosa.  5. Supple Neck, No JVD, No cervical lymphadenopathy appriciated, No Carotid Bruits.  6. Symmetrical Chest wall movement, Good air movement bilaterally, CTAB.  7. RRR, No Gallops, Rubs or Murmurs, No Parasternal Heave.  8. Positive Bowel Sounds, Abdomen Soft, No tenderness, No organomegaly appriciated,No rebound -guarding or rigidity.  9.  No Cyanosis, Normal Skin Turgor, No Skin Rash or Bruise.  10. Good muscle tone,  joints appear normal , no effusions, Normal ROM.  11. No Palpable Lymph Nodes in Neck or Axillae    Data Review:    CBC No results for input(s): WBC, HGB, HCT, PLT, MCV, MCH, MCHC, RDW, LYMPHSABS, MONOABS, EOSABS, BASOSABS, BANDABS in the last 168 hours.  Invalid input(s): NEUTRABS, BANDSABD ------------------------------------------------------------------------------------------------------------------  Chemistries  No results for input(s): NA, K, CL, CO2, GLUCOSE, BUN, CREATININE, CALCIUM, MG, AST, ALT, ALKPHOS, BILITOT in the last 168 hours.  Invalid input(s): GFRCGP ------------------------------------------------------------------------------------------------------------------ CrCl cannot be calculated (Patient's most recent lab result is older than the maximum 21 days allowed.). ------------------------------------------------------------------------------------------------------------------ No results for input(s): TSH, T4TOTAL, T3FREE, THYROIDAB in the last 72 hours.  Invalid input(s): FREET3  Coagulation profile No results for input(s): INR, PROTIME in the last 168 hours. ------------------------------------------------------------------------------------------------------------------- No results for input(s): DDIMER in the last 72  hours. -------------------------------------------------------------------------------------------------------------------  Cardiac Enzymes No results for input(s): CKMB, TROPONINI, MYOGLOBIN in the last 168 hours.  Invalid input(s): CK ------------------------------------------------------------------------------------------------------------------ No results found for: BNP   ---------------------------------------------------------------------------------------------------------------  Urinalysis    Component Value Date/Time   COLORURINE YELLOW 04/28/2011 2036   APPEARANCEUR CLEAR 04/28/2011 2036   LABSPEC 1.009 04/28/2011 2036   PHURINE 6.0 04/28/2011 2036   GLUCOSEU NEGATIVE 04/28/2011 2036   HGBUR NEGATIVE 04/28/2011 2036   BILIRUBINUR NEGATIVE 04/28/2011 2036   KETONESUR 15 (A) 04/28/2011 2036   PROTEINUR NEGATIVE 04/28/2011 2036   UROBILINOGEN 0.2 04/28/2011 2036   NITRITE NEGATIVE 04/28/2011 2036   LEUKOCYTESUR NEGATIVE 04/28/2011 2036    ----------------------------------------------------------------------------------------------------------------   Imaging Results:   Patient's lab, imaging report, chart from [facility] ED has been reviewed   Assessment & Plan:    Active Problems:   SBO (small bowel obstruction) (HCC)   HTN (hypertension)   Crohn's disease (regional enteritis) (  [place])  SBO -  patient with nausea vomiting, evidence of SBO with a transition point at spot of  Crohn's disease flare, we'll keep nothing by mouth, continue with IV fluids, continue with bowel rest, her SBO most likely due to Crohn's flare.  Crohn's disease exacerbation - We'll consult [facility], given her leukocytosis will continue with cefepime and Flagyl, she received IV Solu-Medrol in ED, will await GI consult regarding recommendation for IV steroid therapy, and to continue to hold her amino therapy pending GI consult.  Hypertension - Blood pressure uncontrolled, will continue to hold  home medication as she is nothing by mouth, will start on when necessary hydralazine   hyperlipidemia  - We'll resume statin when able to swallow   DVT Prophylaxis Lovenox - SCDs   AM Labs Ordered, also please review Full Orders  Family Communication: Admission, patients condition and plan of care including tests being ordered have been discussed with the patient and  who indicate understanding and agree with the plan and Code Status.  Code Status Full  Likely DC to  Home  Condition GUARDED    Consults called: GI  Admission status: inpatient  Time spent in minutes : 55 minutes   Daeshon Grammatico M.D on 12/11/2015 at 1:14 PM  Between 7am to 7pm - Pager - [phone]. After 7pm go to [email or web address] - password [facility]  [facility] - Office  [phone]

## 2015-12-11 NOTE — Consult Note (Signed)
Referring Provider: Dr/ Larinda Buttery  Primary Care Physician:  [facility], MD Primary Gastroenterologist:  [name]   Reason for Consultation:  Small bowel obstruction from possible Crohn's  HPI: Tiffany Randall is a 75 y.o. female was seen at [facility] for nausea vomiting , abdominal discomfort along with diarrhea. CT abdomen was performed for further evaluation which revealed dilated proximal small bowel with air-fluid levels and transition zone in the left upper quadrant consistent with a small bowel obstruction.this area showed  bowel inflammation on prior studies. GI is consulted  for further evaluation.  Patient stated that she was doing fine until yesterday when she started having severe generalized abdominal pain along with nausea and nonbloody vomiting. Last bowel movement was yesterday morning which was diarrhea. Her Apriso was recently reduced from 4 tablets a day to 2 tablets a day during last visit in July by [name] as patient was doing better.   Colonoscopy in May 2016 by [name] showed only a small inflammatory polyp. Was last seen on 10/16/2015 and was advised to continue Apriso. History of abnormal CT scan showing lymphadenopathy at  central abdomen status post negative biopsy in February 2016.Negative EGD in May 2016. I do not see any definitive diagnosis of Crohn's in patient's chart.  Past Medical History:  Diagnosis Date  . Arthritis   . Hypertension   . IBS (irritable bowel syndrome)     No past surgical history on file.  Prior to Admission medications   Medication Sig Start Date End Date Taking? Authorizing Provider  acetaminophen (TYLENOL) 500 MG tablet Take 500 mg by mouth every 6 (six) hours as needed for moderate pain.   Yes Historical Provider, MD  amLODipine (NORVASC) 5 MG tablet Take 5 mg by mouth daily.   Yes Historical Provider, MD  atorvastatin (LIPITOR) 20 MG tablet Take 20 mg by mouth daily at 6 PM.   Yes Historical Provider, MD  benazepril  (LOTENSIN) 10 MG tablet Take 10 mg by mouth daily.   Yes Historical Provider, MD  omeprazole (PRILOSEC) 40 MG capsule Take 40 mg by mouth daily.    Yes Historical Provider, MD    Scheduled Meds: . enoxaparin (LOVENOX) injection  40 mg Subcutaneous Q24H  . metronidazole  500 mg Intravenous Q8H   Continuous Infusions: . sodium chloride 75 mL/hr at 12/11/15 1342   PRN Meds:.hydrALAZINE, morphine injection, phenol  Allergies as of 12/11/2015 - Review Complete 12/11/2015  Allergen Reaction Noted  . Contrast media [iodinated diagnostic agents] Itching, Rash, and Other (See Comments) 04/17/2014  . Iohexol Itching and Rash 04/09/2014  . Iron Diarrhea 04/21/2014    No family history on file.  Social History   Social History  . Marital status: Married    Spouse name: N/A  . Number of children: N/A  . Years of education: N/A   Occupational History  . Not on file.   Social History Main Topics  . Smoking status: Never Smoker  . Smokeless tobacco: Not on file  . Alcohol use No  . Drug use: No  . Sexual activity: Not on file   Other Topics Concern  . Not on file   Social History Narrative  . No narrative on file    Review of Systems: All negative except as stated above in HPI.  Physical Exam: Vital signs: Vitals:   12/11/15 1233  BP: (!) 158/64  Pulse: 80  Resp: 16  Temp: 98.1 F (36.7 C)   Last BM Date: 12/10/15 General:  Alert,  Well-developed, well-nourished, pleasant and cooperative in NAD HEENT : NG tube in place. Extraocular movement intact Lungs:  Clear throughout to auscultation.   No wheezes, crackles, or rhonchi. No acute distress. Heart:  Regular rate and rhythm; no murmurs, clicks, rubs,  or gallops. Abdomen: Mildly distended, nontender, bowel sounds present LE : No edema  Rectal:  Deferred  GI:  Lab Results: No results for input(s): WBC, HGB, HCT, PLT in the last 72 hours. BMET No results for input(s): NA, K, CL, CO2, GLUCOSE, BUN, CREATININE,  CALCIUM in the last 72 hours. LFT No results for input(s): PROT, ALBUMIN, AST, ALT, ALKPHOS, BILITOT, BILIDIR, IBILI in the last 72 hours. PT/INR No results for input(s): LABPROT, INR in the last 72 hours.   Studies/Results: No results found.  Impression/Plan: small bowel obstruction. CT showed dilated proximal small bowel with air-fluid levels and transition zone in the left upper quadrant consistent with a small bowel obstruction - ? Crohn's disease. Colonoscopy in May 2016 did not mention of any Crohn's disease. But patient is taking Apriso - Nausea and vomiting. Improved Abdominal pain . Improving   Recommendations --------------------------- - will start IV solumedrol 40 mg BID . Patient's transition point  is at the area of prior inflammation ? From crohn's  - Recommend surgical evaluation - Keep nothing by mouth for now - GI will follow.     LOS: 0 days   Otis Brace  MD, FACP 12/11/2015, 2:58 PM  Pager [phone] If no answer or after 5 PM call [phone]

## 2015-12-11 NOTE — Progress Notes (Signed)
Pharmacy Antibiotic Note  Tiffany Randall is a 75 y.o. female admitted on 12/11/2015 with intra-abdominal infection (?ileitis).  Pharmacy has been consulted for cefepime dosing.  Baseline labs reviewed.  Plan: - Cefepime 1gm IV Q8H - Monitor renal fxn, clinical progress  Height: 4\' 11"  (149.9 cm) Weight: 145 lb (65.8 kg) IBW/kg (Calculated) : 43.2  Temp (24hrs), Avg:98.1 F (36.7 C), Min:98.1 F (36.7 C), Max:98.1 F (36.7 C)   Recent Labs Lab 12/11/15 1433  WBC 14.1*  CREATININE 0.68    Estimated Creatinine Clearance: 50.1 mL/min (by C-G formula based on SCr of 0.68 mg/dL).    Allergies  Allergen Reactions  . Contrast Media [Iodinated Diagnostic Agents] Itching, Rash and Other (See Comments)    Patient broke out in rash and itching 04-09-14 at [facility], patient needs pre meds  . Iohexol Itching and Rash    Pt. needs  pre-meds  . Iron Diarrhea    Antimicrobials this admission: Cefepime 9/22 >>  Dose adjustments this admission: N/A  Microbiology results: N/A   Tiffany Randall D. Mina Marble, PharmD, BCPS Pager:  [phone] 12/11/2015, 4:03 PM

## 2015-12-11 NOTE — Care Management Note (Signed)
Case Management Note  Patient Details  Name: Tiffany Randall MRN: [id number] Date of Birth: 1940/11/23  Subjective/Objective:     Admitted  with intra-abdominal infection (?ileitis), history of Crohn's disease, hypertension, hyperlipidemia, diagnosed with Crohn's last year following. From home with husband Indepenednt with ADL's, no DME usage.         PCP: Jerene Canny  Action/Plan: Return to home when medically stable. CM to f/u with disposition needs.  Expected Discharge Date:                  Expected Discharge Plan:  Home/Self Care (Resides with husband, Tiffany Randall)  In-House Referral:     Discharge planning Services  CM Consult  Post Acute Care Choice:    Choice offered to:     DME Arranged:    DME Agency:     HH Arranged:    HH Agency:     Status of Service:  In process, will continue to follow  If discussed at Long Length of Stay Meetings, dates discussed:    Additional Comments:  Sharin Mons, RN 12/11/2015, 4:10 PM

## 2015-12-11 NOTE — Progress Notes (Signed)
Pharmacy Antibiotic Note  Tiffany Randall is a 75 y.o. female admitted on 12/11/2015 with possible intraabdominal infection (ilelitis?).  Pharmacy has been consulted for Cefepime dosing.  Plan: Cefepime 1 g IV once F/U SCr for further ABX dosing.  Height: 4\' 11"  (149.9 cm) Weight: 145 lb (65.8 kg) IBW/kg (Calculated) : 43.2  Temp (24hrs), Avg:98.1 F (36.7 C), Min:98.1 F (36.7 C), Max:98.1 F (36.7 C)  No results for input(s): WBC, CREATININE, LATICACIDVEN, VANCOTROUGH, VANCOPEAK, VANCORANDOM, GENTTROUGH, GENTPEAK, GENTRANDOM, TOBRATROUGH, TOBRAPEAK, TOBRARND, AMIKACINPEAK, AMIKACINTROU, AMIKACIN in the last 168 hours.  CrCl cannot be calculated (Patient's most recent lab result is older than the maximum 21 days allowed.).    Allergies  Allergen Reactions  . Contrast Media [Iodinated Diagnostic Agents] Itching, Rash and Other (See Comments)    Patient broke out in rash and itching 04-09-14 at [facility], patient needs pre meds  . Iohexol Itching and Rash    Pt. needs  pre-meds  . Iron Diarrhea    Antimicrobials this admission: 9/22 Cefepime >>   Dose adjustments this admission: N/A  Thank you for allowing pharmacy to be a part of this patient's care.  Tiffany Randall, Student-PharmD 12/11/2015 1:22 PM

## 2015-12-12 ENCOUNTER — Encounter (HOSPITAL_COMMUNITY): Payer: Self-pay

## 2015-12-12 DIAGNOSIS — K50012 Crohn's disease of small intestine with intestinal obstruction: Secondary | ICD-10-CM

## 2015-12-12 DIAGNOSIS — I10 Essential (primary) hypertension: Secondary | ICD-10-CM

## 2015-12-12 DIAGNOSIS — K5669 Other intestinal obstruction: Secondary | ICD-10-CM

## 2015-12-12 LAB — CBC
HCT: 39 % (ref 36.0–46.0)
HEMOGLOBIN: 12.4 g/dL (ref 12.0–15.0)
MCH: 27 pg (ref 26.0–34.0)
MCHC: 31.8 g/dL (ref 30.0–36.0)
MCV: 85 fL (ref 78.0–100.0)
PLATELETS: 212 10*3/uL (ref 150–400)
RBC: 4.59 MIL/uL (ref 3.87–5.11)
RDW: 13.1 % (ref 11.5–15.5)
WBC: 14.5 10*3/uL — AB (ref 4.0–10.5)

## 2015-12-12 LAB — BASIC METABOLIC PANEL
ANION GAP: 7 (ref 5–15)
BUN: 14 mg/dL (ref 6–20)
CALCIUM: 8.9 mg/dL (ref 8.9–10.3)
CO2: 24 mmol/L (ref 22–32)
CREATININE: 0.68 mg/dL (ref 0.44–1.00)
Chloride: 110 mmol/L (ref 101–111)
GFR calc non Af Amer: >60 mL/min (ref >60)
Glucose, Bld: 121 mg/dL — ABNORMAL HIGH (ref 65–99)
Potassium: 3.8 mmol/L (ref 3.5–5.1)
SODIUM: 141 mmol/L (ref 135–145)

## 2015-12-12 MED ORDER — LORAZEPAM 2 MG/ML IJ SOLN
0.5000 mg | Freq: Once | INTRAMUSCULAR | Status: AC
  Administered 2015-12-12: 0.5 mg via INTRAVENOUS
  Filled 2015-12-12: qty 1

## 2015-12-12 NOTE — Progress Notes (Signed)
  Subjective: Feels better than prior to admission. Abdominal distention and pain is improved. Has had flatus but no bowel movement.  Objective: Vital signs in last 24 hours: Temp:  [98 F (36.7 C)-98.7 F (37.1 C)] 98 F (36.7 C) (09/23 0521) Pulse Rate:  [71-80] 71 (09/23 0521) Resp:  [16-20] 18 (09/23 0521) BP: (121-158)/(52-69) 121/52 (09/23 0521) SpO2:  [91 %-95 %] 95 % (09/23 0521) Weight:  [65.8 kg (145 lb)] 65.8 kg (145 lb) (09/22 1233) Last BM Date: 12/10/15  Intake/Output from previous day: 09/22 0701 - 09/23 0700 In: 1686.3 [I.V.:1286.3; IV Piggyback:400] Out: -  Intake/Output this shift: Total I/O In: -  Out: 350 [Urine:350]  General appearance: alert, cooperative and no distress GI: Minimal distention. Soft and nontender.  Lab Results:   Recent Labs  12/11/15 1433 12/12/15 0550  WBC 14.1* 14.5*  HGB 14.6 12.4  HCT 45.3 39.0  PLT 234 212   BMET  Recent Labs  12/11/15 1433 12/12/15 0550  NA 142 141  K 4.7 3.8  CL 109 110  CO2 25 24  GLUCOSE 167* 121*  BUN 10 14  CREATININE 0.68 0.68  CALCIUM 9.7 8.9     Studies/Results: Dg Abd Portable 1v  Result Date: 12/11/2015 CLINICAL DATA:  NG tube placement EXAM: PORTABLE ABDOMEN - 1 VIEW COMPARISON:  CT abdomen pelvis dated 12/11/2015 FINDINGS: Enteric tube terminates in the gastric cardia. Mildly dilated loop small bowel in the central abdomen. Transverse colon is not decompressed. IMPRESSION: Enteric tube terminates in the gastric cardia. Electronically Signed   By: Julian Hy M.D.   On: 12/11/2015 19:26    Anti-infectives: Anti-infectives    Start     Dose/Rate Route Frequency Ordered Stop   12/11/15 2200  ceFEPIme (MAXIPIME) 1 g in dextrose 5 % 50 mL IVPB     1 g 100 mL/hr over 30 Minutes Intravenous Every 8 hours 12/11/15 1604     12/11/15 1400  ceFEPIme (MAXIPIME) 1 g in dextrose 5 % 50 mL IVPB     1 g 100 mL/hr over 30 Minutes Intravenous  Once 12/11/15 1315 12/11/15 1416   12/11/15 1330  metroNIDAZOLE (FLAGYL) IVPB 500 mg     500 mg 100 mL/hr over 60 Minutes Intravenous Every 8 hours 12/11/15 1312        Assessment/Plan: Crohn's disease with partial small bowel obstruction, likely flare of inflammatory disease rather than stricture. Seems to be improving on medical management. Continue bowel rest and NG today. Check abdominal x-rays tomorrow.     LOS: 1 day    Joshua Soulier T 9/23/2017Patient ID: Tiffany Randall, female   DOB: 03-07-1941, 75 y.o.   MRN: [id number]

## 2015-12-12 NOTE — Progress Notes (Addendum)
[facility]enter[facility] Progress Note  Tiffany Randall 75 y.o. 1941/02/07   Subjective: Feeling better. Denies abdominal pain or nausea. Clear fluid draining from NG tube. Walked in halls today. Husband and daughter at bedside.  Objective: Vital signs in last 24 hours: Vitals:   12/12/15 0521 12/12/15 1458  BP: (!) 121/52 (!) 152/65  Pulse: 71 72  Resp: 18 17  Temp: 98 F (36.7 C) 97.9 F (36.6 C)   No NG tube drainage volume recorded  Physical Exam: Gen: alert, no acute distress, elderly CV: RRR Chest: CTA B Abd: soft, nontender, nondistended, +BS Ext: no edema  Lab Results:  Recent Labs  12/11/15 1433 12/12/15 0550  NA 142 141  K 4.7 3.8  CL 109 110  CO2 25 24  GLUCOSE 167* 121*  BUN 10 14  CREATININE 0.68 0.68  CALCIUM 9.7 8.9  MG 2.0  --   PHOS 3.6  --     Recent Labs  12/11/15 1433  AST 18  ALT 15  ALKPHOS 105  BILITOT 0.9  PROT 7.2  ALBUMIN 3.8    Recent Labs  12/11/15 1433 12/12/15 0550  WBC 14.1* 14.5*  HGB 14.6 12.4  HCT 45.3 39.0  MCV 85.0 85.0  PLT 234 212   No results for input(s): LABPROT, INR in the last 72 hours.    Assessment/Plan: Partial SBO in the setting of Crohn's disease (pt reports diagnosed 18 months ago) - clinically improving on IV steroids. Continue bowel rest and continue NG suction but likely can clamp tomorrow if she continues to do well. Xray planned tomorrow per Dr. Excell Seltzer. Will follow.   [place] C. 12/12/2015, 5:09 PM  Pager [phone]  If no answer or after 5 PM call [phone] ID: Tiffany Randall, female   DOB: [birth year], 75 y.o.   MRN: [id number]

## 2015-12-12 NOTE — Progress Notes (Signed)
At end of shift change, 1930 on 12/12/15 Pt total NG tube output was 450 ml.

## 2015-12-12 NOTE — Progress Notes (Signed)
PROGRESS NOTE  Tiffany Randall  [id number] DOB: 30-Aug-1940  DOA: 12/11/2015 PCP: Jerene Canny, MD   Brief Narrative:  75 year old female, PMH of HTN, HLD, diagnosed with Crohn's disease last year (sees [name]), presented to [facility] via [facility] ED with complaints of nausea, vomiting, abdominal pain and diarrhea. CT abdomen and pelvis suggested SBO and Crohn's flare. [facility] and general surgery consulting. Treating conservatively. Improving.   Assessment & Plan:   Active Problems:   SBO (small bowel obstruction) (HCC)   HTN (hypertension)   Crohn's disease (regional enteritis) (HCC)   Partial small bowel obstruction - Possibly related to Crohn's exacerbation. - Treating supportively with bowel rest, NG tube, IV fluids, pain management, serial exams and x-rays. - Clinically improving. - General surgery and GI consultation appreciated. As per surgery follow-up, continue current management for additional 24 hours and then reassess regarding advancing diet.  Crohn's exacerbation - As per GI, started IV Solu-Medrol 40 mg twice a day. Patient's transition point is at the area of prior inflammation. - Remains on empiric IV cefepime and Flagyl,? Discontinue.  Essential hypertension - When necessary IV hydralazine. Reasonable inpatient control.  Hyperlipidemia - Start oral medications when able to tolerate.     DVT prophylaxis: Lovenox Code Status: Full Family Communication: Discussed in detail with patient's daughter (she is an Therapist, sports) at bedside. Updated care and answered questions. Disposition Plan: DC home when medically improved.   Consultants:   General surgery  [facility]  Procedures:   NG tube  Antimicrobials:   IV cefepime  IV Flagyl    Subjective: Feels much better than on admission. Passed lots of flatus yesterday and small amount this morning. No BM. Throat feels sore. Abdominal distention and pain have significantly improved.    Objective:  Vitals:   12/11/15 1233 12/11/15 1712 12/11/15 2049 12/12/15 0521  BP: (!) 158/64 (!) 157/69 (!) 132/53 (!) 121/52  Pulse: 80 80 75 71  Resp: 16 18 20 18   Temp: 98.1 F (36.7 C) 98.7 F (37.1 C) 98.2 F (36.8 C) 98 F (36.7 C)  TempSrc: Oral Oral    SpO2: 92% 91% 95% 95%  Weight: 65.8 kg (145 lb)     Height: 4\' 11"  (1.499 m)       Intake/Output Summary (Last 24 hours) at 12/12/15 1411 Last data filed at 12/12/15 0858  Gross per 24 hour  Intake          1686.25 ml  Output              350 ml  Net          1336.25 ml   Filed Weights   12/11/15 1233  Weight: 65.8 kg (145 lb)    Examination:  General exam: Pleasant elderly female lying comfortably supine in bed. Respiratory system: Clear to auscultation. Respiratory effort normal. Cardiovascular system: S1 & S2 heard, RRR. No JVD, murmurs, rubs, gallops or clicks. No pedal edema. Gastrointestinal system: Abdomen is nondistended, soft and nontender. No organomegaly or masses felt. Normal bowel sounds heard. NG tube +. Central nervous system: Alert and oriented. No focal neurological deficits. Extremities: Symmetric 5 x 5 power. Skin: No rashes, lesions or ulcers Psychiatry: Judgement and insight appear normal. Mood & affect appropriate.     Data Reviewed: I have personally reviewed following labs and imaging studies  CBC:  Recent Labs Lab 12/11/15 1433 12/12/15 0550  WBC 14.1* 14.5*  HGB 14.6 12.4  HCT 45.3 39.0  MCV 85.0 85.0  PLT 234  [id number]   Basic Metabolic Panel:  Recent Labs Lab 12/11/15 1433 12/12/15 0550  NA 142 141  K 4.7 3.8  CL 109 110  CO2 25 24  GLUCOSE 167* 121*  BUN 10 14  CREATININE 0.68 0.68  CALCIUM 9.7 8.9  MG 2.0  --   PHOS 3.6  --    GFR: Estimated Creatinine Clearance: 50.1 mL/min (by C-G formula based on SCr of 0.68 mg/dL). Liver Function Tests:  Recent Labs Lab 12/11/15 1433  AST 18  ALT 15  ALKPHOS 105  BILITOT 0.9  PROT 7.2  ALBUMIN 3.8   No  results for input(s): LIPASE, AMYLASE in the last 168 hours. No results for input(s): AMMONIA in the last 168 hours. Coagulation Profile: No results for input(s): INR, PROTIME in the last 168 hours. Cardiac Enzymes: No results for input(s): CKTOTAL, CKMB, CKMBINDEX, TROPONINI in the last 168 hours. BNP (last 3 results) No results for input(s): PROBNP in the last 8760 hours. HbA1C: No results for input(s): HGBA1C in the last 72 hours. CBG: No results for input(s): GLUCAP in the last 168 hours. Lipid Profile: No results for input(s): CHOL, HDL, LDLCALC, TRIG, CHOLHDL, LDLDIRECT in the last 72 hours. Thyroid Function Tests: No results for input(s): TSH, T4TOTAL, FREET4, T3FREE, THYROIDAB in the last 72 hours. Anemia Panel: No results for input(s): VITAMINB12, FOLATE, FERRITIN, TIBC, IRON, RETICCTPCT in the last 72 hours.  Sepsis Labs: No results for input(s): PROCALCITON, LATICACIDVEN in the last 168 hours.  No results found for this or any previous visit (from the past 240 hour(s)).       Radiology Studies: Dg Abd Portable 1v  Result Date: 12/11/2015 CLINICAL DATA:  NG tube placement EXAM: PORTABLE ABDOMEN - 1 VIEW COMPARISON:  CT abdomen pelvis dated 12/11/2015 FINDINGS: Enteric tube terminates in the gastric cardia. Mildly dilated loop small bowel in the central abdomen. Transverse colon is not decompressed. IMPRESSION: Enteric tube terminates in the gastric cardia. Electronically Signed   By: Julian Hy M.D.   On: 12/11/2015 19:26        Scheduled Meds: . ceFEPime (MAXIPIME) IV  1 g Intravenous Q8H  . enoxaparin (LOVENOX) injection  40 mg Subcutaneous Q24H  . methylPREDNISolone (SOLU-MEDROL) injection  40 mg Intravenous Q12H  . metronidazole  500 mg Intravenous Q8H   Continuous Infusions: . sodium chloride 75 mL/hr at 12/11/15 1342     LOS: 1 day    Time spent: 30 minutes.    [facility], MD [facility] Pager [phone]  If 7PM-7AM,  please contact night-coverage [email or web address] Password [facility] 12/12/2015, 2:11 PM

## 2015-12-13 ENCOUNTER — Inpatient Hospital Stay (HOSPITAL_COMMUNITY): Payer: Medicare Other

## 2015-12-13 LAB — BASIC METABOLIC PANEL
Anion gap: 9 (ref 5–15)
BUN: 15 mg/dL (ref 6–20)
CHLORIDE: 108 mmol/L (ref 101–111)
CO2: 24 mmol/L (ref 22–32)
CREATININE: 0.64 mg/dL (ref 0.44–1.00)
Calcium: 9.1 mg/dL (ref 8.9–10.3)
GFR calc Af Amer: >60 mL/min (ref >60)
GFR calc non Af Amer: >60 mL/min (ref >60)
GLUCOSE: 115 mg/dL — AB (ref 65–99)
Potassium: 3.5 mmol/L (ref 3.5–5.1)
SODIUM: 141 mmol/L (ref 135–145)

## 2015-12-13 MED ORDER — ZOLPIDEM TARTRATE 5 MG PO TABS
5.0000 mg | ORAL_TABLET | Freq: Every evening | ORAL | Status: DC | PRN
  Administered 2015-12-13: 5 mg via ORAL
  Filled 2015-12-13: qty 1

## 2015-12-13 NOTE — Progress Notes (Signed)
Patient NG tube out put for night shift 12/12/15 1900- 12/13/15 0643 was 100 mL.

## 2015-12-13 NOTE — Progress Notes (Signed)
[facility] Gastroenterology Progress Note  Tiffany Randall 75 y.o. [birth year]   Subjective: Feels ok. Denies abdominal pain. Passed flatus yesterday but denies any today. No BMs. Daughter at bedside.  Objective: Vital signs in last 24 hours: Vitals:   12/13/15 0457 12/13/15 0700  BP: (!) 146/49 (!) 151/66  Pulse: 68   Resp: 18   Temp: 98.4 F (36.9 C)    Semi-cloudy (nonbilious) fluid from NGT  Physical Exam: Gen: alert, no acute distress, elderly CV: RRR Chest: CTA B Abd: soft, nontender, nondistended, +BS  Lab Results:  Recent Labs  12/11/15 1433 12/12/15 0550 12/13/15 0457  NA 142 141 141  K 4.7 3.8 3.5  CL 109 110 108  CO2 25 24 24   GLUCOSE 167* 121* 115*  BUN 10 14 15   CREATININE 0.68 0.68 0.64  CALCIUM 9.7 8.9 9.1  MG 2.0  --   --   PHOS 3.6  --   --     Recent Labs  12/11/15 1433  AST 18  ALT 15  ALKPHOS 105  BILITOT 0.9  PROT 7.2  ALBUMIN 3.8    Recent Labs  12/11/15 1433 12/12/15 0550  WBC 14.1* 14.5*  HGB 14.6 12.4  HCT 45.3 39.0  MCV 85.0 85.0  PLT 234 212   No results for input(s): LABPROT, INR in the last 72 hours.  Xray negative for obstruction; moderate stool burden seen  Assessment/Plan: Resolving partial SBO in setting of Crohn's disease. Obstruction resolved on Xray. Agree with surgery recs to d/c NGT and start sips of water and ice chips. Will continue IV steroids today and transition to PO Prednisone tomorrow. Continue IV Flagyl today and d/c tomorrow. Likely will need to be back on Apriso 4 capsules per day (instead of 2 capsules) at discharge and f/u with [name] in October. [facility] will f/u tomorrow.  [place] C. 12/13/2015, 12:20 PM  Pager [phone]  If no answer or after 5 PM call [phone] ID: Tiffany Randall, female   DOB: [birth year], 75 y.o.   MRN: [id number]

## 2015-12-13 NOTE — Progress Notes (Signed)
PROGRESS NOTE  Tiffany Randall  [id number] DOB: [birth year]  DOA: 12/11/2015 PCP: Jerene Canny, MD   Brief Narrative:  75 year old female, PMH of HTN, HLD, diagnosed with Crohn's disease last year (sees [name]), presented to [facility] via [facility] ED with complaints of nausea, vomiting, abdominal pain and diarrhea. CT abdomen and pelvis suggested SBO and Crohn's flare. [facility] and general surgery consulting. Treating conservatively. Improving.   Assessment & Plan:   Active Problems:   SBO (small bowel obstruction) (HCC)   HTN (hypertension)   Crohn's disease (regional enteritis) (HCC)   Partial small bowel obstruction - Possibly related to Crohn's exacerbation. - Treated supportively with bowel rest, NG tube, IV fluids, pain management, serial exams and x-rays. - Clinically improving. KUB 9/24, shows SBO has resolved. - General surgery and GI follow-up appreciated. DC NG tube and start ice and sips of water.  Crohn's exacerbation - As per GI, started IV Solu-Medrol 40 mg twice a day. Patient's transition point is at the area of prior inflammation. - As per GI follow-up, continuing IV steroids today and transition to oral prednisone tomorrow, continue antibiotics for today and discontinue tomorrow, likely will need to be back on Aprisio 4 capsules daily instead of 2 capsules at discharge and follow-up as outpatient.  Essential hypertension - When necessary IV hydralazine. Reasonable inpatient control.  Hyperlipidemia - Start oral medications when able to tolerate.  Constipation - Bowel regimen when able to take by mouth.    DVT prophylaxis: Lovenox Code Status: Full Family Communication: No family at bedside. Disposition Plan: DC home when medically improved.   Consultants:   General surgery  [facility]  Procedures:   NG tube  Antimicrobials:   IV cefepime  IV Flagyl    Subjective: "Hunger pains". Past more flatus day before than she did yesterday. No  BMs yet. No abdominal distention.  Objective:  Vitals:   12/12/15 2253 12/13/15 0457 12/13/15 0700 12/13/15 1354  BP: (!) 153/53 (!) 146/49 (!) 151/66 (!) 140/58  Pulse: 74 68  (!) 56  Resp:  18  20  Temp:  98.4 F (36.9 C)  98.1 F (36.7 C)  TempSrc:  Oral  Oral  SpO2:  95%  94%  Weight:      Height:        Intake/Output Summary (Last 24 hours) at 12/13/15 1539 Last data filed at 12/13/15 [id number]  Gross per 24 hour  Intake              865 ml  Output              500 ml  Net              365 ml   Filed Weights   12/11/15 1233  Weight: 65.8 kg (145 lb)    Examination:  General exam: Pleasant elderly female lying comfortably supine in bed. Respiratory system: Clear to auscultation. Respiratory effort normal. Cardiovascular system: S1 & S2 heard, RRR. No JVD, murmurs, rubs, gallops or clicks. No pedal edema. Gastrointestinal system: Abdomen is nondistended, soft and nontender. No organomegaly or masses felt. Normal bowel sounds heard. NG tube +(Seen this morning prior to discontinuation). Central nervous system: Alert and oriented. No focal neurological deficits. Extremities: Symmetric 5 x 5 power. Skin: No rashes, lesions or ulcers Psychiatry: Judgement and insight appear normal. Mood & affect appropriate.     Data Reviewed: I have personally reviewed following labs and imaging studies  CBC:  Recent Labs Lab 12/11/15 1433 12/12/15  0550  WBC 14.1* 14.5*  HGB 14.6 12.4  HCT 45.3 39.0  MCV 85.0 85.0  PLT 234 [id number]   Basic Metabolic Panel:  Recent Labs Lab 12/11/15 1433 12/12/15 0550 12/13/15 0457  NA 142 141 141  K 4.7 3.8 3.5  CL 109 110 108  CO2 25 24 24   GLUCOSE 167* 121* 115*  BUN 10 14 15   CREATININE 0.68 0.68 0.64  CALCIUM 9.7 8.9 9.1  MG 2.0  --   --   PHOS 3.6  --   --    GFR: Estimated Creatinine Clearance: 50.1 mL/min (by C-G formula based on SCr of 0.64 mg/dL). Liver Function Tests:  Recent Labs Lab 12/11/15 1433  AST 18  ALT 15    ALKPHOS 105  BILITOT 0.9  PROT 7.2  ALBUMIN 3.8   No results for input(s): LIPASE, AMYLASE in the last 168 hours. No results for input(s): AMMONIA in the last 168 hours. Coagulation Profile: No results for input(s): INR, PROTIME in the last 168 hours. Cardiac Enzymes: No results for input(s): CKTOTAL, CKMB, CKMBINDEX, TROPONINI in the last 168 hours. BNP (last 3 results) No results for input(s): PROBNP in the last 8760 hours. HbA1C: No results for input(s): HGBA1C in the last 72 hours. CBG: No results for input(s): GLUCAP in the last 168 hours. Lipid Profile: No results for input(s): CHOL, HDL, LDLCALC, TRIG, CHOLHDL, LDLDIRECT in the last 72 hours. Thyroid Function Tests: No results for input(s): TSH, T4TOTAL, FREET4, T3FREE, THYROIDAB in the last 72 hours. Anemia Panel: No results for input(s): VITAMINB12, FOLATE, FERRITIN, TIBC, IRON, RETICCTPCT in the last 72 hours.  Sepsis Labs: No results for input(s): PROCALCITON, LATICACIDVEN in the last 168 hours.  No results found for this or any previous visit (from the past 240 hour(s)).       Radiology Studies: Dg Abd 2 Views  Result Date: 12/13/2015 CLINICAL DATA:  Nausea, vomiting, abdominal discomfort and diarrhea. History small bowel obstruction. EXAM: ABDOMEN - 2 VIEW COMPARISON:  12/11/2015 CT as well as abdominal radiographs. FINDINGS: Moderate fecal retention along the ascending and transverse colon There is no evidence of free air. Degenerative disc disease L1-2 mild levoconvex curvature of the upper lumbar spine. No pneumo retroperitoneum. Enteric tube extends into the gastric cardia, unchanged in position. Interval resolution of previously noted mildly dilated small bowel loop in the central abdomen. IMPRESSION: Moderate colonic stool burden. No bowel obstruction. Enteric tube terminates in the gastric cardia. Electronically Signed   By: Ashley Royalty M.D.   On: 12/13/2015 10:06   Dg Abd Portable 1v  Result Date:  12/11/2015 CLINICAL DATA:  NG tube placement EXAM: PORTABLE ABDOMEN - 1 VIEW COMPARISON:  CT abdomen pelvis dated 12/11/2015 FINDINGS: Enteric tube terminates in the gastric cardia. Mildly dilated loop small bowel in the central abdomen. Transverse colon is not decompressed. IMPRESSION: Enteric tube terminates in the gastric cardia. Electronically Signed   By: Julian Hy M.D.   On: 12/11/2015 19:26        Scheduled Meds: . ceFEPime (MAXIPIME) IV  1 g Intravenous Q8H  . enoxaparin (LOVENOX) injection  40 mg Subcutaneous Q24H  . methylPREDNISolone (SOLU-MEDROL) injection  40 mg Intravenous Q12H  . metronidazole  500 mg Intravenous Q8H   Continuous Infusions: . sodium chloride 75 mL/hr at 12/12/15 2353     LOS: 2 days      [facility], MD [facility] Pager [phone]  If 7PM-7AM, please contact night-coverage [email or web address] Password [facility] 12/13/2015, 3:39 PM

## 2015-12-13 NOTE — Progress Notes (Signed)
Orders to D/C NG tube. NG tube removed at 1435. Intact upon removal. Pt tolerated removal well. No signs or symptoms of complications. Will continue to monitor.

## 2015-12-13 NOTE — Progress Notes (Signed)
Pharmacy Antibiotic Note  Tiffany Randall is a 75 y.o. female admitted on 12/11/2015 with intra-abdominal infection. Today is Day #3 of abx with cefepime/metronidazole. Currently afebrile. WBC 14.1>14.5. SCr 0.68, corrected CrCl 41 mL/min, which is below the threshold for dose reduced cefepime.   Plan: Decrease cefepime to 1gIVq24h Monitor renal fxn, clinical progress  Height: 4\' 11"  (149.9 cm) Weight: 145 lb (65.8 kg) IBW/kg (Calculated) : 43.2  Temp (24hrs), Avg:98.3 F (36.8 C), Min:98.1 F (36.7 C), Max:98.4 F (36.9 C)   Recent Labs Lab 12/11/15 1433 12/12/15 0550 12/13/15 0457  WBC 14.1* 14.5*  --   CREATININE 0.68 0.68 0.64    Estimated Creatinine Clearance: 50.1 mL/min (by C-G formula based on SCr of 0.64 mg/dL).    Allergies  Allergen Reactions  . Contrast Media [Iodinated Diagnostic Agents] Itching, Rash and Other (See Comments)    Patient broke out in rash and itching 04-09-14 at [facility], patient needs pre meds  . Iohexol Itching and Rash    Pt. needs  pre-meds  . Iron Diarrhea    Antimicrobials this admission: Cefepime 9/22 >>  Dose adjustments this admission: Cefepime 1g IVq8h to [place], BS, PharmD Clinical Pharmacy Resident [phone] (Pager) 12/13/2015 3:09 PM

## 2015-12-13 NOTE — Progress Notes (Signed)
  Subjective: Denies abdominal pain. Has had flatus but no bowel movement.  Objective: Vital signs in last 24 hours: Temp:  [97.9 F (36.6 C)-98.4 F (36.9 C)] 98.4 F (36.9 C) (09/24 0457) Pulse Rate:  [68-74] 68 (09/24 0457) Resp:  [17-18] 18 (09/24 0457) BP: (146-165)/(49-66) 151/66 (09/24 0700) SpO2:  [95 %-97 %] 95 % (09/24 0457) Last BM Date: 12/10/15  Intake/Output from previous day: 09/23 0701 - 09/24 0700 In: 865 [I.V.:565; IV Piggyback:300] Out: 850 [Urine:850] Intake/Output this shift: No intake/output data recorded.  General appearance: alert, cooperative and no distress GI: No distention Soft and nontender.  Lab Results:   Recent Labs  12/11/15 1433 12/12/15 0550  WBC 14.1* 14.5*  HGB 14.6 12.4  HCT 45.3 39.0  PLT 234 212   BMET  Recent Labs  12/12/15 0550 12/13/15 0457  NA 141 141  K 3.8 3.5  CL 110 108  CO2 24 24  GLUCOSE 121* 115*  BUN 14 15  CREATININE 0.68 0.64  CALCIUM 8.9 9.1     Studies/Results: Dg Abd 2 Views  Result Date: 12/13/2015 CLINICAL DATA:  Nausea, vomiting, abdominal discomfort and diarrhea. History small bowel obstruction. EXAM: ABDOMEN - 2 VIEW COMPARISON:  12/11/2015 CT as well as abdominal radiographs. FINDINGS: Moderate fecal retention along the ascending and transverse colon There is no evidence of free air. Degenerative disc disease L1-2 mild levoconvex curvature of the upper lumbar spine. No pneumo retroperitoneum. Enteric tube extends into the gastric cardia, unchanged in position. Interval resolution of previously noted mildly dilated small bowel loop in the central abdomen. IMPRESSION: Moderate colonic stool burden. No bowel obstruction. Enteric tube terminates in the gastric cardia. Electronically Signed   By: Ashley Royalty M.D.   On: 12/13/2015 10:06   Dg Abd Portable 1v  Result Date: 12/11/2015 CLINICAL DATA:  NG tube placement EXAM: PORTABLE ABDOMEN - 1 VIEW COMPARISON:  CT abdomen pelvis dated 12/11/2015  FINDINGS: Enteric tube terminates in the gastric cardia. Mildly dilated loop small bowel in the central abdomen. Transverse colon is not decompressed. IMPRESSION: Enteric tube terminates in the gastric cardia. Electronically Signed   By: Julian Hy M.D.   On: 12/11/2015 19:26    Anti-infectives: Anti-infectives    Start     Dose/Rate Route Frequency Ordered Stop   12/11/15 2200  ceFEPIme (MAXIPIME) 1 g in dextrose 5 % 50 mL IVPB     1 g 100 mL/hr over 30 Minutes Intravenous Every 8 hours 12/11/15 1604     12/11/15 1400  ceFEPIme (MAXIPIME) 1 g in dextrose 5 % 50 mL IVPB     1 g 100 mL/hr over 30 Minutes Intravenous  Once 12/11/15 1315 12/11/15 1416   12/11/15 1330  metroNIDAZOLE (FLAGYL) IVPB 500 mg     500 mg 100 mL/hr over 60 Minutes Intravenous Every 8 hours 12/11/15 1312        Assessment/Plan: Crohn's disease with partial small bowel obstruction, likely flare of inflammatory disease rather than stricture. Seems to be improving on medical management. X-rays show resolved obstruction. We'll discontinue NG tube. Ice & sips of water by mouth.     LOS: 2 days    Arjan Strohm T 9/24/2017Patient ID: Tiffany Randall, female   DOB: 09-Sep-1940, 75 y.o.   MRN: [id number] Patient ID: Tiffany Randall, female   DOB: 1941-02-01, 75 y.o.   MRN: [id number]

## 2015-12-13 NOTE — Progress Notes (Signed)
Patient's BP 146/49. MD Wendee Beavers notified about DBP of 49.  Will continue to monitor and notify MD as needed.

## 2015-12-14 MED ORDER — PREDNISONE 20 MG PO TABS
40.0000 mg | ORAL_TABLET | Freq: Every day | ORAL | Status: DC
  Administered 2015-12-14: 40 mg via ORAL
  Filled 2015-12-14: qty 2

## 2015-12-14 MED ORDER — MESALAMINE ER 0.375 G PO CP24: 1500.0000 mg | ORAL_CAPSULE | Freq: Every day | ORAL | 0 refills | Status: AC

## 2015-12-14 MED ORDER — PREDNISONE 20 MG PO TABS: 40.0000 mg | ORAL_TABLET | Freq: Every day | ORAL | 0 refills | Status: AC

## 2015-12-14 NOTE — Progress Notes (Signed)
[facility] Gastroenterology Progress Note  Jimmy Dunnington 75 y.o. 06-06-1940   Subjective: Had a BM this morning. Denies abdominal pain. Wants to eat some broth. Anxious to go home soon because her husband is having knee surgery today. Sitting in bedside chair.  Objective: Vital signs in last 24 hours: Vitals:   12/13/15 2112 12/14/15 0434  BP:  (!) 136/59  Pulse:  62  Resp:  18  Temp: 98.3 F (36.8 C) 97.5 F (36.4 C)    Physical Exam: Gen: alert, no acute distress, well-nourished CV: RRR Chest: CTA B Abd: soft, nontender, nondistended, +BS Ext: no edema  Lab Results:  Recent Labs  12/11/15 1433 12/12/15 0550 12/13/15 0457  NA 142 141 141  K 4.7 3.8 3.5  CL 109 110 108  CO2 25 24 24   GLUCOSE 167* 121* 115*  BUN 10 14 15   CREATININE 0.68 0.68 0.64  CALCIUM 9.7 8.9 9.1  MG 2.0  --   --   PHOS 3.6  --   --     Recent Labs  12/11/15 1433  AST 18  ALT 15  ALKPHOS 105  BILITOT 0.9  PROT 7.2  ALBUMIN 3.8    Recent Labs  12/11/15 1433 12/12/15 0550  WBC 14.1* 14.5*  HGB 14.6 12.4  HCT 45.3 39.0  MCV 85.0 85.0  PLT 234 212   No results for input(s): LABPROT, INR in the last 72 hours.    Assessment/Plan: Resolving partial SBO in the setting of Crohn's - improvement on IV steroids. Will change to PO Prednisone 40 mg/day. Tolerated d/c of NG yesterday without worsened pain. Start clear liquid diet and slowly advance as tolerated. If tolerates diet, then ok for d/c tomorrow.   [place] C. 12/14/2015, 8:53 AM  Pager [phone]  If no answer or after 5 PM call [phone] ID: [name], female   DOB: [birth year], 75 y.o.   MRN: [id number]

## 2015-12-14 NOTE — Progress Notes (Signed)
  Subjective: Denies pain, cramping, nausea.  Continues to pass gas and have bowel movement.    Objective: Vital signs in last 24 hours: Temp:  [97.5 F (36.4 C)-98.3 F (36.8 C)] 97.5 F (36.4 C) (09/25 0434) Pulse Rate:  [56-63] 62 (09/25 0434) Resp:  [18-20] 18 (09/25 0434) BP: (136-156)/(58-63) 136/59 (09/25 0434) SpO2:  [94 %-97 %] 97 % (09/25 0434) Last BM Date: 12/14/15  Intake/Output from previous day: 09/24 0701 - 09/25 0700 In: 1199.4 [I.V.:899.4; IV Piggyback:300] Out: 1000 [Urine:1000] Intake/Output this shift: No intake/output data recorded.  General appearance: alert, cooperative and no distress GI: No distention Soft and nontender.  Lab Results:   Recent Labs  12/11/15 1433 12/12/15 0550  WBC 14.1* 14.5*  HGB 14.6 12.4  HCT 45.3 39.0  PLT 234 212   BMET  Recent Labs  12/12/15 0550 12/13/15 0457  NA 141 141  K 3.8 3.5  CL 110 108  CO2 24 24  GLUCOSE 121* 115*  BUN 14 15  CREATININE 0.68 0.64  CALCIUM 8.9 9.1     Studies/Results: Dg Abd 2 Views  Result Date: 12/13/2015 CLINICAL DATA:  Nausea, vomiting, abdominal discomfort and diarrhea. History small bowel obstruction. EXAM: ABDOMEN - 2 VIEW COMPARISON:  12/11/2015 CT as well as abdominal radiographs. FINDINGS: Moderate fecal retention along the ascending and transverse colon There is no evidence of free air. Degenerative disc disease L1-2 mild levoconvex curvature of the upper lumbar spine. No pneumo retroperitoneum. Enteric tube extends into the gastric cardia, unchanged in position. Interval resolution of previously noted mildly dilated small bowel loop in the central abdomen. IMPRESSION: Moderate colonic stool burden. No bowel obstruction. Enteric tube terminates in the gastric cardia. Electronically Signed   By: Ashley Royalty M.D.   On: 12/13/2015 10:06    Anti-infectives: Anti-infectives    Start     Dose/Rate Route Frequency Ordered Stop   12/11/15 2200  ceFEPIme (MAXIPIME) 1 g in  dextrose 5 % 50 mL IVPB     1 g 100 mL/hr over 30 Minutes Intravenous Every 8 hours 12/11/15 1604     12/11/15 1400  ceFEPIme (MAXIPIME) 1 g in dextrose 5 % 50 mL IVPB     1 g 100 mL/hr over 30 Minutes Intravenous  Once 12/11/15 1315 12/11/15 1416   12/11/15 1330  metroNIDAZOLE (FLAGYL) IVPB 500 mg     500 mg 100 mL/hr over 60 Minutes Intravenous Every 8 hours 12/11/15 1312        Assessment/Plan: Crohn's disease with partial small bowel obstruction, likely flare of inflammatory disease rather than stricture. Resolved Continue immunosuppressive meds. Full liquids for lunch.  If does well with no pain, reasonable to d/c home on full liquids this afternoon.  Pt has husband having surgery at [facility] long this afternnoon.  If recurrent symptoms, would pursue transfer to [place] for family well being.    LOS: 3 days    [facility] 12/14/2015

## 2015-12-14 NOTE — Discharge Summary (Signed)
Physician Discharge Summary  Tiffany Randall [id number] DOB: 1940/07/16  PCP: Jerene Canny, MD  Admit date: 12/11/2015 Discharge date: 12/14/2015  Admitted From: Home Disposition:  Home  Recommendations for Outpatient Follow-up:  1. Dr. Jerene Canny, PCP in 1 week with repeat labs (CBC & BMP) 2. Dr. Clarene Essex, [facility] in 2 weeks.  Home Health: None Equipment/Devices: None    Discharge Condition: Improved and stable.  CODE STATUS: Full  Diet recommendation: Full liquid diet for a couple of days and then advance to soft diet as tolerated.  Discharge Diagnoses:  Active Problems:   SBO (small bowel obstruction) (HCC)   HTN (hypertension)   Crohn's disease (regional enteritis) ([place])   Brief/Interim Summary: 75 year old female, PMH of HTN, HLD, diagnosed with Crohn's disease last year (sees [name]), presented to [facility] via [facility] ED with complaints of nausea, vomiting, abdominal pain and diarrhea. CT abdomen and pelvis suggested SBO and Crohn's flare. [facility] and general surgery consulted.   Assessment & Plan:   Partial small bowel obstruction - Felt likely due to flare of inflammatory disease/Crohn's rather than stricture. - Treated supportively with bowel rest, NG tube, IV fluids, pain management, serial exams and x-rays. - SBO has resolved. Patient is tolerating full liquid diet. She is anxious to be discharged today because her husband is having surgery at [facility] this afternoon and would like to be present around him. After discussing with GI and general surgery, it was felt safe to DC patient on full liquid diet for a couple days followed by soft diet as tolerated. Patient is very knowledgeable about her disease and management from prior experience and has multiple medical personnel in her family (daughter is an OR Marine scientist, daughter in law is a PA & son-in-law is a IM MD) and will be able to monitor her. Discussed with patient's daughter-in-law at  bedside. - As discussed with Dr. Michail Sermon, DC home on Apriso increased to 4 tabs daily and prednisone 40 MG daily until outpatient follow-up with [name] in 2-3 weeks. Patient was advised not to stop prednisone abruptly which will have to be gradually tapered and she verbalizes understanding.  Crohn's exacerbation - Treated with IV Solu-Medrol in the hospital, transitioned to oral prednisone. Continue prednisone and Apriso at discharge. Outpatient follow-up with GI.Marland Kitchen  Essential hypertension - Treated with IV hydralazine when necessary while she was nothing by mouth. Mildly uncontrolled and fluctuating. Resume home medications (amlodipine and benazepril) at discharge.  Hyperlipidemia - Continue atorvastatin  Constipation - Patient had a BM this morning.    Discharge Instructions  Discharge Instructions    Call MD for:  persistant nausea and vomiting    Complete by:  As directed    Call MD for:  severe uncontrolled pain    Complete by:  As directed    Discharge instructions    Complete by:  As directed    DIET: Continue full liquid diet (instructions provided) for a couple of days and then can advance to soft diet as tolerated.   Increase activity slowly    Complete by:  As directed        Medication List    TAKE these medications   acetaminophen 500 MG tablet Commonly known as:  TYLENOL Take 500 mg by mouth every 6 (six) hours as needed for moderate pain.   amLODipine 5 MG tablet Commonly known as:  NORVASC Take 5 mg by mouth daily.   atorvastatin 20 MG tablet Commonly known as:  LIPITOR  Take 20 mg by mouth daily at 6 PM.   benazepril 10 MG tablet Commonly known as:  LOTENSIN Take 10 mg by mouth daily.   mesalamine 0.375 g 24 hr capsule Commonly known as:  APRISO Take 4 capsules (1.5 g total) by mouth daily. What changed:  how much to take   omeprazole 40 MG capsule Commonly known as:  PRILOSEC Take 40 mg by mouth daily.   predniSONE 20 MG  tablet Commonly known as:  DELTASONE Take 2 tablets (40 mg total) by mouth daily with breakfast. Start taking on:  12/15/2015      Follow-up Information    [facility], MD. Schedule an appointment as soon as possible for a visit in 1 week(s).   Specialty:  Family Medicine Why:  To be seen with repeat labs (CBC & BMP). Contact information: [place] [address] [phone]        [facility] E, MD. Schedule an appointment as soon as possible for a visit in 2 week(s).   Specialty:  Gastroenterology Contact information: [id number] [address] [phone]          Allergies  Allergen Reactions  . Contrast Media [Iodinated Diagnostic Agents] Itching, Rash and Other (See Comments)    Patient broke out in rash and itching 04-09-14 at [facility], patient needs pre meds  . Iohexol Itching and Rash    Pt. needs  pre-meds  . Iron Diarrhea    Consultations:  [facility]  General Surgery   Procedures/Studies: Dg Abd 2 Views  Result Date: 12/13/2015 CLINICAL DATA:  Nausea, vomiting, abdominal discomfort and diarrhea. History small bowel obstruction. EXAM: ABDOMEN - 2 VIEW COMPARISON:  12/11/2015 CT as well as abdominal radiographs. FINDINGS: Moderate fecal retention along the ascending and transverse colon There is no evidence of free air. Degenerative disc disease L1-2 mild levoconvex curvature of the upper lumbar spine. No pneumo retroperitoneum. Enteric tube extends into the gastric cardia, unchanged in position. Interval resolution of previously noted mildly dilated small bowel loop in the central abdomen. IMPRESSION: Moderate colonic stool burden. No bowel obstruction. Enteric tube terminates in the gastric cardia. Electronically Signed   By: Ashley Royalty M.D.   On: 12/13/2015 10:06   Dg Abd Portable 1v  Result Date: 12/11/2015 CLINICAL DATA:  NG tube placement EXAM: PORTABLE ABDOMEN - 1 VIEW COMPARISON:  CT abdomen  pelvis dated 12/11/2015 FINDINGS: Enteric tube terminates in the gastric cardia. Mildly dilated loop small bowel in the central abdomen. Transverse colon is not decompressed. IMPRESSION: Enteric tube terminates in the gastric cardia. Electronically Signed   By: Julian Hy M.D.   On: 12/11/2015 19:26      Subjective: Patient had a BM today. Tolerating full liquid diet without nausea, vomiting or abdominal pain. Anxious to be discharged because her spouse of 75 years is undergoing any surgery at [facility] this afternoon.  Discharge Exam:  Vitals:   12/13/15 2109 12/13/15 2112 12/14/15 0434 12/14/15 1337  BP: (!) 156/63  (!) 136/59 (!) 168/57  Pulse: 63  62 (!) 59  Resp: 18  18 19   Temp:  98.3 F (36.8 C) 97.5 F (36.4 C) 97.4 F (36.3 C)  TempSrc:  Oral Oral Oral  SpO2: 96%  97% 98%  Weight:      Height:        General exam: Pleasant elderly female lying comfortably supine in bed. Respiratory system: Clear to auscultation. Respiratory effort normal.  Cardiovascular system: S1 & S2 heard, RRR. No JVD, murmurs, rubs, gallops or clicks. No pedal edema. Gastrointestinal system: Abdomen is nondistended, soft and nontender. No organomegaly or masses felt. Normal bowel sounds heard.  Central nervous system: Alert and oriented. No focal neurological deficits. Extremities: Symmetric 5 x 5 power. Skin: No rashes, lesions or ulcers Psychiatry: Judgement and insight appear normal. Mood & affect appropriate.     The results of significant diagnostics from this hospitalization (including imaging, microbiology, ancillary and laboratory) are listed below for reference.     Microbiology: No results found for this or any previous visit (from the past 240 hour(s)).   Labs: BNP (last 3 results) No results for input(s): BNP in the last 8760 hours. Basic Metabolic Panel:  Recent Labs Lab 12/11/15 1433 12/12/15 0550 12/13/15 0457  NA 142 141 141  K 4.7 3.8 3.5  CL 109  110 108  CO2 25 24 24   GLUCOSE 167* 121* 115*  BUN 10 14 15   CREATININE 0.68 0.68 0.64  CALCIUM 9.7 8.9 9.1  MG 2.0  --   --   PHOS 3.6  --   --    Liver Function Tests:  Recent Labs Lab 12/11/15 1433  AST 18  ALT 15  ALKPHOS 105  BILITOT 0.9  PROT 7.2  ALBUMIN 3.8   No results for input(s): LIPASE, AMYLASE in the last 168 hours. No results for input(s): AMMONIA in the last 168 hours. CBC:  Recent Labs Lab 12/11/15 1433 12/12/15 0550  WBC 14.1* 14.5*  HGB 14.6 12.4  HCT 45.3 39.0  MCV 85.0 85.0  PLT 234 212      Time coordinating discharge: Over 30 minutes  SIGNED:  Vernell Leep, MD, FACP, FHM. [facility] Pager [phone]  If 7PM-7AM, please contact night-coverage [email or web address] Password [facility] 12/14/2015, 4:44 PM

## 2015-12-14 NOTE — Discharge Instructions (Addendum)
Small Bowel Obstruction °A small bowel obstruction is a blockage in the small bowel. The small bowel, which is also called the small intestine, is a long, slender tube that connects the stomach to the colon. When a person eats and drinks, food and fluids go from the stomach to the small bowel. This is where most of the nutrients in the food and fluids are absorbed. °A small bowel obstruction will prevent food and fluids from passing through the small bowel as they normally do during digestion. The small bowel can become partially or completely blocked. This can cause symptoms such as abdominal pain, vomiting, and bloating. If this condition is not treated, it can be dangerous because the small bowel could rupture. °CAUSES °Common causes of this condition include: °· Scar tissue from previous surgery or radiation treatment. °· Recent surgery. This may cause the movements of the bowel to slow down and cause food to block the intestine. °· Hernias. °· Inflammatory bowel disease (colitis). °· Twisting of the bowel (volvulus). °· Tumors. °· A foreign body. °· Slipping of a part of the bowel into another part (intussusception). °SYMPTOMS °Symptoms of this condition include: °· Abdominal pain. This may be dull cramps or sharp pain. It may occur in one area, or it may be present in the entire abdomen. Pain can range from mild to severe, depending on the degree of obstruction. °· Nausea and vomiting. Vomit may be greenish or a yellow bile color. °· Abdominal bloating. °· Constipation. °· Lack of passing gas. °· Frequent belching. °· Diarrhea. This may occur if the obstruction is partial and runny stool is able to leak around the obstruction. °DIAGNOSIS °This condition may be diagnosed based on a physical exam, medical history, and X-rays of the abdomen. You may also have other tests, such as a CT scan of the abdomen and pelvis. °TREATMENT °Treatment for this condition depends on the cause and severity of the problem.  Treatment options may include: °· Bed rest along with fluids and pain medicines that are given through an IV tube inserted into one of your veins. Sometimes, this is all that is needed for the obstruction to improve. °· Following a simple diet. In some cases, a clear liquid diet may be required for several days. This allows the bowel to rest. °· Placement of a small tube (nasogastric tube) into the stomach. When the bowel is blocked, it usually swells up like a balloon that is filled with air and fluids. The air and fluids may be removed by suction through the nasogastric tube. This can help with pain, discomfort, and nausea. It can also help the obstruction to clear up faster. °· Surgery. This may be required if other treatments do not work. Bowel obstruction from a hernia may require early surgery and can be an emergency procedure. Surgery may also be required for scar tissue that causes frequent or severe obstructions. °HOME CARE INSTRUCTIONS °· Get plenty of rest. °· Follow instructions from your health care provider about eating restrictions. You may need to avoid solid foods and consume only clear liquids until your condition improves. °· Take over-the-counter and prescription medicines only as told by your health care provider. °· Keep all follow-up visits as told by your health care provider. This is important. °SEEK MEDICAL CARE IF: °· You have a fever. °· You have chills. °SEEK IMMEDIATE MEDICAL CARE IF: °· You have increased pain or cramping. °· You vomit blood. °· You have uncontrolled vomiting or nausea. °· You cannot drink   fluids because of vomiting or pain.  You develop confusion.  You begin feeling very dry or thirsty (dehydrated).  You have severe bloating.  You feel extremely weak or you faint.   This information is not intended to replace advice given to you by your health care provider. Make sure you discuss any questions you have with your health care provider.   Document Released:  05/24/2005 Document Revised: 11/26/2014 Document Reviewed: 05/01/2014 Elsevier Interactive Patient Education 2016 [place] Disease Crohn disease is a long-lasting (chronic) disease that affects your gastrointestinal (GI) tract. It often causes irritation and swelling (inflammation) in your small intestine and the beginning of your large intestine. However, it can affect any part of your GI tract. Crohn disease is part of a group of illnesses that are known as inflammatory bowel disease (IBD). Crohn disease may start slowly and get worse over time. Symptoms may come and go. They may also disappear for months or even years at a time (remission). CAUSES The exact cause of Crohn disease is not known. It may be a response that causes your body's defense system (immune system) to mistakenly attack healthy cells and tissues (autoimmune response). Your genes and your environment may also play a role. RISK FACTORS You may be at greater risk for Crohn disease if you:  Have other family members with Crohn disease or another IBD.  Use any tobacco products, including cigarettes, chewing tobacco, or electronic cigarettes.  Are in your 29s.  Have [place] European ancestry. SIGNS AND SYMPTOMS The main signs and symptoms of Crohn disease involve your GI tract. These include:  Diarrhea.  Rectal bleeding.  An urgent need to move your bowels.  The feeling that you are not finished having a bowel movement.  Abdominal pain or cramping.  Constipation. General signs and symptoms of Crohn disease may also include:  Unexplained weight loss.  Fatigue.  Fever.  Nausea.  Loss of appetite.  Joint pain  Changes in vision.  Red bumps on your skin. DIAGNOSIS Your health care provider may suspect Crohn disease based on your symptoms and your medical history. Your health care provider will do a physical exam. You may need to see a health care provider who specializes in diseases of the  digestive tract (gastroenterologist). You may also have tests to help your health care providers make a diagnosis. These may include:  Blood tests.  Stool sample tests.  Imaging tests, such as X-rays and CT scans.  Tests to examine the inside of your intestines using a long, flexible tube that has a light and a camera on the end (endoscopy or colonoscopy).  A procedure to take tissue samples from inside your bowel (biopsy) to be examined under a microscope. TREATMENT  There is no cure for Crohn disease. Treatment will focus on managing your symptoms. Crohn disease affects each person differently. Your treatment may include:  Resting your bowels. Drinking only clear liquids or getting nutrition through an IV for a period of time gives your bowels a chance to heal because they are not passing stools.  Medicines. These may be used alone or in combination (combination therapy). These may include antibiotic medicines. You may be given medicines that help to:  Reduce inflammation.  Control your immune system activity.  Fight infections.  Relieve cramps and prevent diarrhea.  Control your pain.  Surgery. You may need surgery if:  Medicines and other treatments are no longer working.  You develop complications from severe Crohn disease.  A  section of your intestine becomes so damaged that it needs to be removed. HOME CARE INSTRUCTIONS  Take medicines only as directed by your health care provider.  If you were prescribed an antibiotic medicine, finish it all even if you start to feel better.  Keep all follow-up visits as directed by your health care provider. This is important.  Talk with your health care provider about changing your diet. This may help your symptoms. Your health care provide may recommend changes, such as:  Drinking more fluids.  Avoiding milk and other foods that contain lactose.  Eating a low-fat diet.  Avoiding high-fiber foods, such as popcorn and  nuts.  Avoiding carbonated beverages, such as soda.  Eating smaller meals more often rather than eating large meals.  Keeping a food diary to identify foods that make your symptoms better or worse.  Do not use any tobacco products, including cigarettes, chewing tobacco, or electronic cigarettes. If you need help quitting, ask your health care provider.  Limit alcohol intake to no more than 1 drink per day for nonpregnant women and 2 drinks per day for men. One drink equals 12 ounces of beer, 5 ounces of wine, or 1 ounces of hard liquor.  Exercise daily or as directed by your health care provider. SEEK MEDICAL CARE IF:  You have diarrhea, abdominal cramps, and other gastrointestinal problems that are present almost all of the time.  Your symptoms do not improve with treatment.  You continue to lose weight.  You develop a rash or sores on your skin.  You develop eye problems.  You have a fever.   Your symptoms get worse.  You develop new symptoms. SEEK IMMEDIATE MEDICAL CARE IF:  You have bloody diarrhea.  You develop severe abdominal pain.  You cannot pass stools.   This information is not intended to replace advice given to you by your health care provider. Make sure you discuss any questions you have with your health care provider.   Document Released: 12/15/2004 Document Revised: 03/28/2014 Document Reviewed: 10/23/2013 Elsevier Interactive Patient Education 2016 Elsevier Inc.  Full Liquid Diet A full liquid diet may be used:   To help you transition from a clear liquid diet to a soft diet.   When your body is healing and can only tolerate foods that are easy to digest.  Before or after certain a procedure, test, or surgery (such as stomach or intestinal surgeries).   If you have trouble swallowing or chewing.  A full liquid diet includes fluids and foods that are liquid or will become liquid at room temperature. The full liquid diet gives you the  proteins, fluids, salts, and minerals that you need for energy. If you continue this diet for more than 72 hours, talk to your health care provider about how many calories you need to consume. If you continue the diet for more than 5 days, talk to your health care provider about taking a multivitamin or a nutritional supplement. WHAT DO I NEED TO KNOW ABOUT A FULL LIQUID DIET?  You may have any liquid.  You may have any food that becomes a liquid at room temperature. The food is considered a liquid if it can be poured off a spoon at room temperature.  Drink one serving of citrus or vitamin C-enriched fruit juice daily. WHAT FOODS CAN I EAT? Grains Any grain food that can be pureed in soup (such as crackers, pasta, and rice). Hot cereal (such as farina or oatmeal) that has been  blended. Talk to your health care provider or dietitian about these foods. Vegetables Pulp-free tomato or vegetable juice. Vegetables pureed in soup.  Fruits Fruit juice, including nectars and juices with pulp. Meats and Other Protein Sources Eggs in custard, eggnog mix, and eggs used in ice cream or pudding. Strained meats, like in baby food, may be allowed. Consult your health care provider.  Dairy Milk and milk-based beverages, including milk shakes and instant breakfast mixes. Smooth yogurt. Pureed cottage cheese. Avoid these foods if they are not well tolerated. Beverages All beverages, including liquid nutritional supplements. Ask your health care provider if you can have carbonated beverages. They may not be well tolerated. Condiments Iodized salt, pepper, spices, and flavorings. Cocoa powder. Vinegar, ketchup, yellow mustard, smooth sauces (such as hollandaise, cheese sauce, or white sauce), and soy sauce. Sweets and Desserts Custard, smooth pudding. Flavored gelatin. Tapioca, junket. Plain ice cream, sherbet, fruit ices. Frozen ice pops, frozen fudge pops, pudding pops, and other frozen bars with cream.  Syrups, including chocolate syrup. Sugar, honey, jelly.  Fats and Oils Margarine, butter, cream, sour cream, and oils. Other Broth and cream soups. Strained, broth-based soups. The items listed above may not be a complete list of recommended foods or beverages. Contact your dietitian for more options.  WHAT FOODS CAN I NOT EAT? Grains All breads. Grains are not allowed unless they are pureed into soup. Vegetables Vegetables are not allowed unless they are juiced, or cooked and pureed into soup. Fruits Fruits are not allowed unless they are juiced. Meats and Other Protein Sources Any meat or fish. Cooked or raw eggs. Nut butters.  Dairy Cheese.  Condiments Stone ground mustards. Fats and Oils Fats that are coarse or chunky. Sweets and Desserts Ice cream or other frozen desserts that have any solids in them or on top, such as nuts, chocolate chips, and pieces of cookies. Cakes. Cookies. Candy. Others Soups with chunks or pieces in them. The items listed above may not be a complete list of foods and beverages to avoid. Contact your dietitian for more information.   This information is not intended to replace advice given to you by your health care provider. Make sure you discuss any questions you have with your health care provider.   Document Released: 03/07/2005 Document Revised: 03/12/2013 Document Reviewed: 01/10/2013 Elsevier Interactive Patient Education [employer].

## 2015-12-14 NOTE — Care Management Important Message (Signed)
Important Message  Patient Details  Name: Tiffany Randall MRN: [id number] Date of Birth: Feb 21, 1941   Medicare Important Message Given:  Yes    Nathen May 12/14/2015, 11:33 AM

## 2015-12-14 NOTE — Progress Notes (Signed)
Dr Algis Liming informed patient had a bowel movement. Does not want to do anything at this time. Will continue to monitor. [name], MSN, RN, [employer]

## 2015-12-14 NOTE — Progress Notes (Signed)
NURSING PROGRESS NOTE  Tiffany Randall [id number] Discharge Data: 12/14/2015 5:35 PM Attending Provider: Modena Jansky, MD [id number], MD   Tiffany Randall to be D/C'd Home per MD order.    All IV's will be discontinued and monitored for bleeding.  All belongings will be returned to patient for patient to take home.  Last Documented Vital Signs:  Blood pressure (!) 168/57, pulse (!) 59, temperature 97.4 F (36.3 C), temperature source Oral, resp. rate 19, height 4\' 11"  (1.499 m), weight 65.8 kg (145 lb), SpO2 98 %.  [name], MSN, RN, CMSRN   \

## 2017-08-01 IMAGING — DX DG ABDOMEN 2V
2 series · 2 of 2 positions shown · non-contrast
Comparison: 12/11/2015 CT as well as abdominal radiographs.

CLINICAL DATA: Nausea, vomiting, abdominal discomfort and diarrhea.
History small bowel obstruction.

EXAM:
ABDOMEN - 2 VIEW

[abdomen erect]
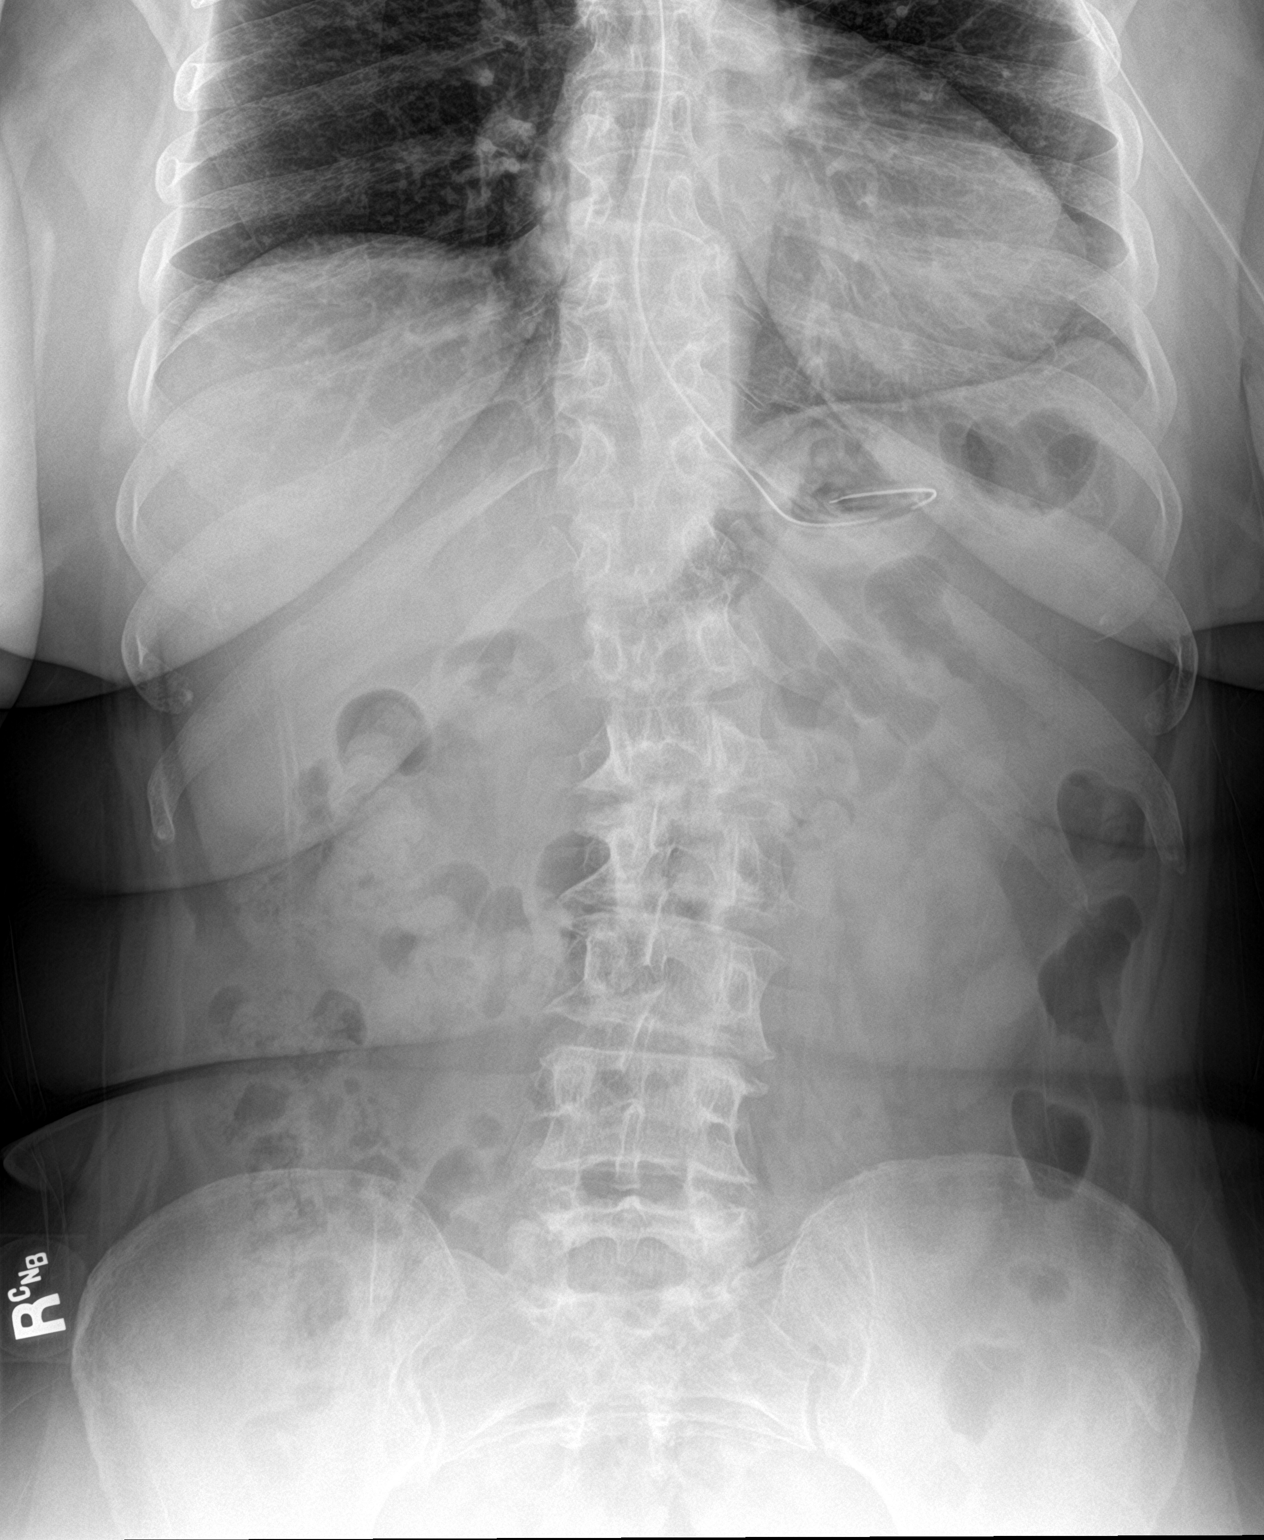

[abdomen supine]
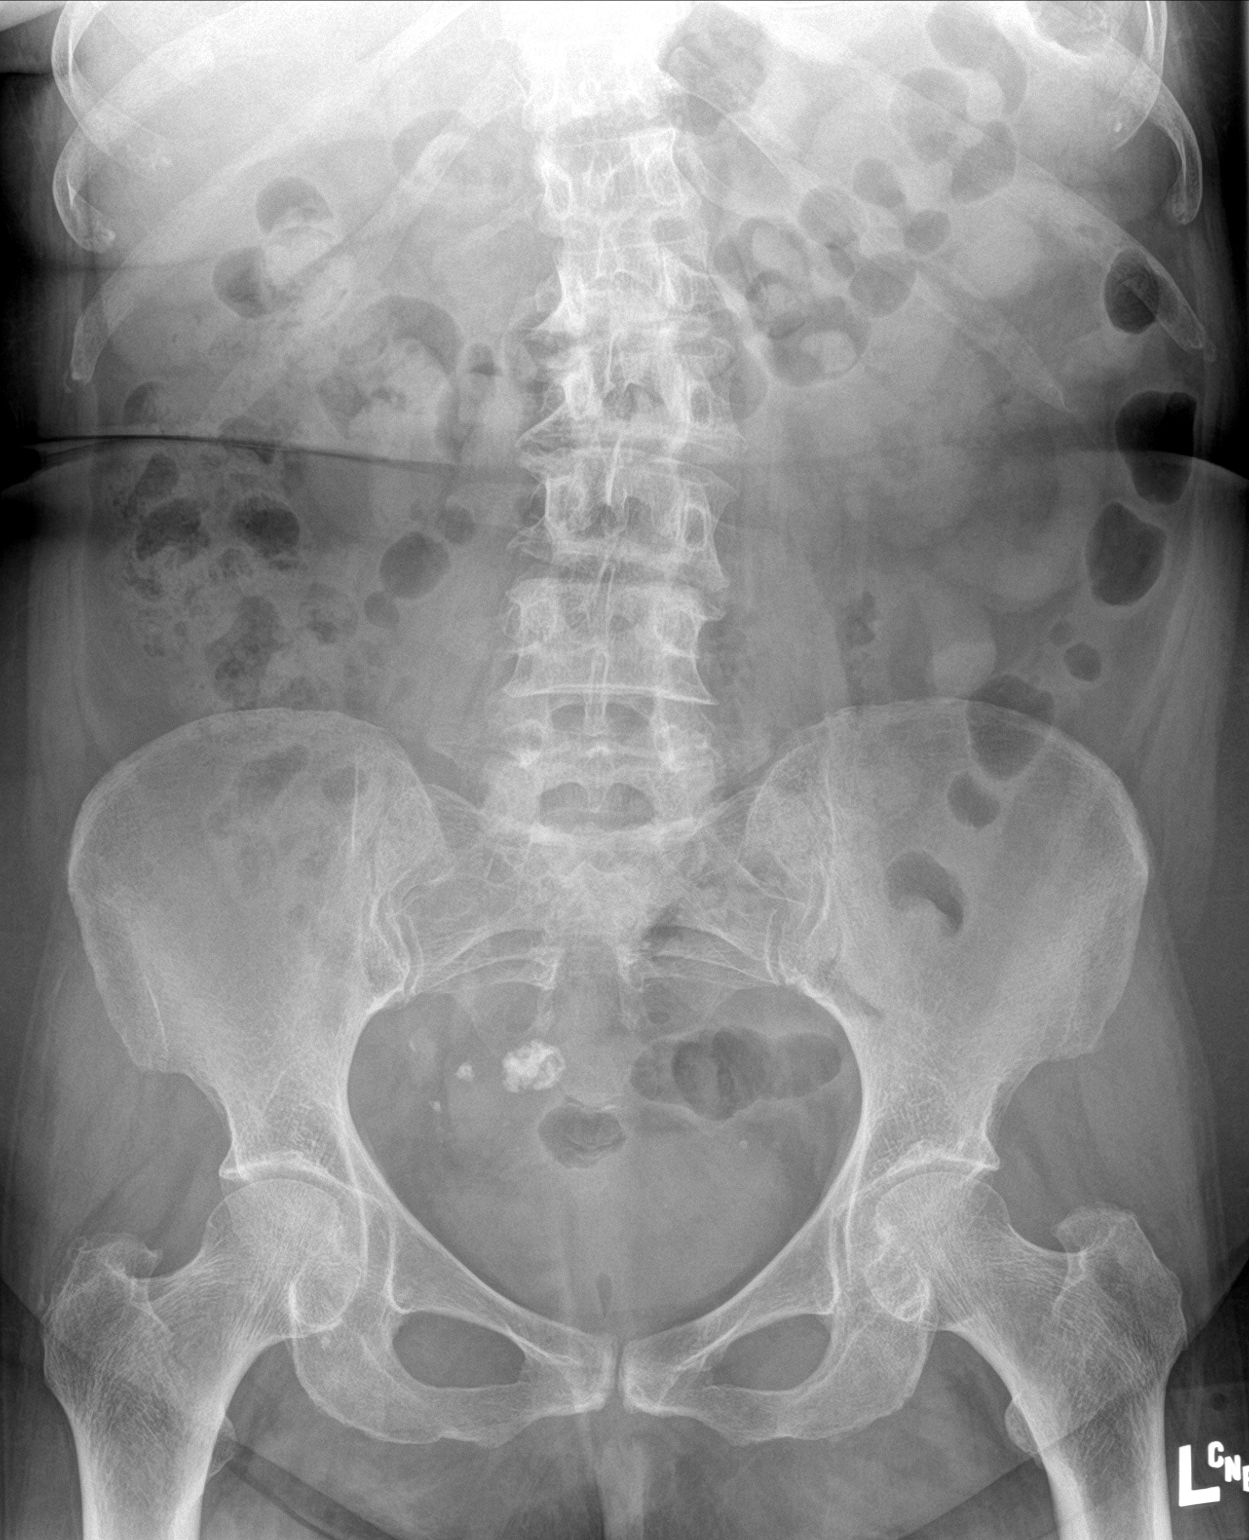

[2 of 2 positions shown; findings below may reference images not displayed]

FINDINGS: Moderate fecal retention along the ascending and transverse colon
There is no evidence of free air. Degenerative disc disease L1-2
mild levoconvex curvature of the upper lumbar spine. No pneumo
retroperitoneum. Enteric tube extends into the gastric cardia,
unchanged in position. Interval resolution of previously noted
mildly dilated small bowel loop in the central abdomen.
IMPRESSION: Moderate colonic stool burden. No bowel obstruction. Enteric tube
terminates in the gastric cardia.

## 2021-09-15 ENCOUNTER — Other Ambulatory Visit (HOSPITAL_COMMUNITY): Payer: Self-pay | Admitting: Gastroenterology

## 2021-09-15 ENCOUNTER — Other Ambulatory Visit: Payer: Self-pay | Admitting: Gastroenterology

## 2021-09-15 DIAGNOSIS — R140 Abdominal distension (gaseous): Secondary | ICD-10-CM

## 2021-09-15 DIAGNOSIS — R109 Unspecified abdominal pain: Secondary | ICD-10-CM

## 2021-10-05 ENCOUNTER — Ambulatory Visit (HOSPITAL_COMMUNITY): Payer: Medicare Other

## 2021-10-05 ENCOUNTER — Encounter (HOSPITAL_COMMUNITY): Payer: Self-pay
